# Patient Record
Sex: Male | Born: 1965 | Race: White | Hispanic: No | Marital: Married | Smoking: Current every day smoker
Health system: Southern US, Community
[De-identification: ages and names within clinical notes are randomized; demographics above are authoritative.]

## PROBLEM LIST (undated history)

## (undated) DIAGNOSIS — Z720 Tobacco use: Secondary | ICD-10-CM

## (undated) DIAGNOSIS — I2510 Atherosclerotic heart disease of native coronary artery without angina pectoris: Secondary | ICD-10-CM

## (undated) DIAGNOSIS — R0789 Other chest pain: Secondary | ICD-10-CM

## (undated) HISTORY — DX: Atherosclerotic heart disease of native coronary artery without angina pectoris: I25.10

## (undated) HISTORY — DX: Tobacco use: Z72.0

## (undated) HISTORY — DX: Other chest pain: R07.89

---

## 2004-08-10 ENCOUNTER — Emergency Department (HOSPITAL_COMMUNITY): Admission: EM | Admit: 2004-08-10 | Discharge: 2004-08-10 | Payer: Self-pay | Admitting: Emergency Medicine

## 2011-03-07 ENCOUNTER — Emergency Department (HOSPITAL_COMMUNITY)
Admission: EM | Admit: 2011-03-07 | Discharge: 2011-03-07 | Disposition: A | Discharge status: Home / Self Care | Payer: Worker's Compensation | Attending: Emergency Medicine | Admitting: Emergency Medicine

## 2011-03-07 ENCOUNTER — Emergency Department (HOSPITAL_COMMUNITY): Payer: Worker's Compensation

## 2011-03-07 ENCOUNTER — Other Ambulatory Visit: Payer: Self-pay

## 2011-03-07 DIAGNOSIS — R0789 Other chest pain: Secondary | ICD-10-CM

## 2011-03-07 DIAGNOSIS — R0602 Shortness of breath: Secondary | ICD-10-CM | POA: Insufficient documentation

## 2011-03-07 DIAGNOSIS — F17200 Nicotine dependence, unspecified, uncomplicated: Secondary | ICD-10-CM | POA: Insufficient documentation

## 2011-03-07 LAB — DIFFERENTIAL
Eosinophils Absolute: 0.1 10*3/uL (ref 0.0–0.7)
Monocytes Absolute: 0.8 10*3/uL (ref 0.1–1.0)
Neutro Abs: 6.5 10*3/uL (ref 1.7–7.7)

## 2011-03-07 LAB — CBC
MCHC: 34.4 g/dL (ref 30.0–36.0)
RBC: 4.74 MIL/uL (ref 4.22–5.81)
RDW: 14.1 % (ref 11.5–15.5)

## 2011-03-07 LAB — BASIC METABOLIC PANEL
BUN: 16 mg/dL (ref 6–23)
Creatinine, Ser: 1.39 mg/dL — ABNORMAL HIGH (ref 0.50–1.35)
GFR calc Af Amer: 69 mL/min — ABNORMAL LOW (ref >90)
GFR calc non Af Amer: 60 mL/min — ABNORMAL LOW (ref >90)
Glucose, Bld: 101 mg/dL — ABNORMAL HIGH (ref 70–99)

## 2011-03-07 LAB — CARDIAC PANEL(CRET KIN+CKTOT+MB+TROPI)
CK, MB: 2.4 ng/mL (ref 0.3–4.0)
Total CK: 113 U/L (ref 7–232)
Troponin I: <0.3 ng/mL (ref <0.30)

## 2011-03-07 NOTE — ED Notes (Signed)
Patient transported to X-ray 
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
## 2011-03-07 NOTE — ED Notes (Signed)
Patient returned from X-ray 

## 2011-03-07 NOTE — ED Provider Notes (Signed)
Medical screening examination/treatment/procedure(s) were performed by non-physician practitioner and as supervising physician I was immediately available for consultation/collaboration.   Dione Booze, MD 03/07/11 [phone]

## 2011-03-23 ENCOUNTER — Telehealth: Payer: Self-pay | Admitting: Cardiology

## 2011-03-23 NOTE — Telephone Encounter (Signed)
Left Message for Eric Wall at Dr Daryel Gerald office no fax received unless Pam has it and she is off today. Provided fax number for re-fax.

## 2011-03-23 NOTE — Telephone Encounter (Signed)
DID YOU RE FAX FROM DR HUBBARD ON THIS PT? PLS CALL AND LET HER KNOW EITHER WAY

## 2011-04-01 ENCOUNTER — Ambulatory Visit (INDEPENDENT_AMBULATORY_CARE_PROVIDER_SITE_OTHER): Payer: Worker's Compensation | Admitting: Cardiology

## 2011-04-01 ENCOUNTER — Encounter: Payer: Self-pay | Admitting: Cardiology

## 2011-04-01 DIAGNOSIS — E781 Pure hyperglyceridemia: Secondary | ICD-10-CM | POA: Insufficient documentation

## 2011-04-01 DIAGNOSIS — R072 Precordial pain: Secondary | ICD-10-CM

## 2011-04-01 DIAGNOSIS — R079 Chest pain, unspecified: Secondary | ICD-10-CM

## 2011-04-01 DIAGNOSIS — F17200 Nicotine dependence, unspecified, uncomplicated: Secondary | ICD-10-CM

## 2011-04-01 DIAGNOSIS — Z720 Tobacco use: Secondary | ICD-10-CM | POA: Insufficient documentation

## 2011-04-01 DIAGNOSIS — R0789 Other chest pain: Secondary | ICD-10-CM | POA: Insufficient documentation

## 2011-04-01 NOTE — Assessment & Plan Note (Signed)
I think his chest pain is somewhat atypical but he does have significant risk factors.  I will bring the patient back for a POET (Plain Old Exercise Test). This will allow me to screen for obstructive coronary disease, risk stratify and very importantly provide a prescription for exercise.

## 2011-04-01 NOTE — Patient Instructions (Addendum)
Your physician has requested that you have an exercise tolerance test. For further information please visit [email or web address] Please also follow instruction sheet, as given.  The current medical regimen is effective;  continue present plan and medications. Cholesterol Control Diet Cholesterol levels in your body are determined significantly by your diet. Cholesterol levels may also be related to heart disease. The following material helps to explain this relationship and discusses what you can do to help keep your heart healthy. Not all cholesterol is bad. Low-density lipoprotein (LDL) cholesterol is the "bad" cholesterol. It may cause fatty deposits to build up inside your arteries. High-density lipoprotein (HDL) cholesterol is "good." It helps to remove the "bad" LDL cholesterol from your blood. Cholesterol is a very important risk factor for heart disease. Other risk factors are high blood pressure, smoking, stress, heredity, and weight. The heart muscle gets its supply of blood through the coronary arteries. If your LDL cholesterol is high and your HDL cholesterol is low, you are at risk for having fatty deposits build up in your coronary arteries. This leaves less room through which blood can flow. Without sufficient blood and oxygen, the heart muscle cannot function properly and you may feel chest pains (angina pectoris). When a coronary artery closes up entirely, a part of the heart muscle may die, causing a heart attack (myocardial infarction). CHECKING CHOLESTEROL When your caregiver sends your blood to a lab to be analyzed for cholesterol, a complete lipid (fat) profile may be done. With this test, the total amount of cholesterol and levels of LDL and HDL are determined. Triglycerides are a type of fat that circulates in the blood and can also be used to determine heart disease risk. The list below describes what the numbers should be: Test: Total Cholesterol.  Less than 200 mg/dl.  Test: LDL  "bad cholesterol."  Less than 100 mg/dl.   Less than 70 mg/dl if you are at very high risk of a heart attack or sudden cardiac death.  Test: HDL "good cholesterol."  Greater than 50 mg/dl for women.   Greater than 40 mg/dl for men.  Test: Triglycerides.  Less than 150 mg/dl.  CONTROLLING CHOLESTEROL WITH DIET Although exercise and lifestyle factors are important, your diet is key. That is because certain foods are known to raise cholesterol and others to lower it. The goal is to balance foods for their effect on cholesterol and more importantly, to replace saturated and trans fat with other types of fat, such as monounsaturated fat, polyunsaturated fat, and omega-3 fatty acids. On average, a person should consume no more than 15 to 17 g of saturated fat daily. Saturated and trans fats are considered "bad" fats, and they will raise LDL cholesterol. Saturated fats are primarily found in animal products such as meats, butter, and cream. However, that does not mean you need to sacrifice all your favorite foods. Today, there are good tasting, low-fat, low-cholesterol substitutes for most of the things you like to eat. Choose low-fat or nonfat alternatives. Choose round or loin cuts of red meat, since these types of cuts are lowest in fat and cholesterol. Chicken (without the skin), fish, veal, and ground [place] breast are excellent choices. Eliminate fatty meats, such as hot dogs and salami. Even shellfish have little or no saturated fat. Have a 3 oz (85 g) portion when you eat lean meat, poultry, or fish. Trans fats are also called "partially hydrogenated oils." They are oils that have been scientifically manipulated so that they are solid at  room temperature resulting in a longer shelf life and improved taste and texture of foods in which they are added. Trans fats are found in stick margarine, some tub margarines, cookies, crackers, and baked goods.  When baking and cooking, oils are an excellent  substitute for butter. The monounsaturated oils are especially beneficial since it is believed they lower LDL and raise HDL. The oils you should avoid entirely are saturated tropical oils, such as coconut and palm.  Remember to eat liberally from food groups that are naturally free of saturated and trans fat, including fish, fruit, vegetables, beans, grains (barley, rice, couscous, bulgur wheat), and pasta (without cream sauces).  IDENTIFYING FOODS THAT LOWER CHOLESTEROL  Soluble fiber may lower your cholesterol. This type of fiber is found in fruits such as apples, vegetables such as broccoli, potatoes, and carrots, legumes such as beans, peas, and lentils, and grains such as barley. Foods fortified with plant sterols (phytosterol) may also lower cholesterol. You should eat at least 2 g per day of these foods for a cholesterol lowering effect.  Read package labels to identify low-saturated fats, trans fats free, and low-fat foods at the supermarket. Select cheeses that have only 2 to 3 g saturated fat per ounce. Use a heart-healthy tub margarine that is free of trans fats or partially hydrogenated oil. When buying baked goods (cookies, crackers), avoid partially hydrogenated oils. Breads and muffins should be made from whole grains (whole-wheat or whole oat flour, instead of "flour" or "enriched flour"). Buy non-creamy canned soups with reduced salt and no added fats.  FOOD PREPARATION TECHNIQUES  Never deep-fry. If you must fry, either stir-fry, which uses very little fat, or use non-stick cooking sprays. When possible, broil, bake, or roast meats, and steam vegetables. Instead of dressing vegetables with butter or margarine, use lemon and herbs, applesauce and cinnamon (for squash and sweet potatoes), nonfat yogurt, salsa, and low-fat dressings for salads.  LOW-SATURATED FAT / LOW-FAT FOOD SUBSTITUTES Meats / Saturated Fat (g)  Avoid: Steak, marbled (3 oz/85 g) / 11 g   Choose: Steak, lean (3 oz/85 g)  / 4 g   Avoid: Hamburger (3 oz/85 g) / 7 g   Choose: Hamburger, lean (3 oz/85 g) / 5 g   Avoid: Ham (3 oz/85 g) / 6 g   Choose: Ham, lean cut (3 oz/85 g) / 2.4 g   Avoid: Chicken, with skin, dark meat (3 oz/85 g) / 4 g   Choose: Chicken, skin removed, dark meat (3 oz/85 g) / 2 g   Avoid: Chicken, with skin, light meat (3 oz/85 g) / 2.5 g   Choose: Chicken, skin removed, light meat (3 oz/85 g) / 1 g  Dairy / Saturated Fat (g)  Avoid: Whole milk (1 cup) / 5 g   Choose: Low-fat milk, 2% (1 cup) / 3 g   Choose: Low-fat milk, 1% (1 cup) / 1.5 g   Choose: Skim milk (1 cup) / 0.3 g   Avoid: Hard cheese (1 oz/28 g) / 6 g   Choose: Skim milk cheese (1 oz/28 g) / 2 to 3 g   Avoid: Cottage cheese, 4% fat (1 cup) / 6.5 g   Choose: Low-fat cottage cheese, 1% fat (1 cup) / 1.5 g   Avoid: Ice cream (1 cup) / 9 g   Choose: Sherbet (1 cup) / 2.5 g   Choose: Nonfat frozen yogurt (1 cup) / 0.3 g   Choose: Frozen fruit bar / trace   Avoid:  Whipped cream (1 tbs) / 3.5 g   Choose: Nondairy whipped topping (1 tbs) / 1 g  Condiments / Saturated Fat (g)  Avoid: Mayonnaise (1 tbs) / 2 g   Choose: Low-fat mayonnaise (1 tbs) / 1 g   Avoid: Butter (1 tbs) / 7 g   Choose: Extra light margarine (1 tbs) / 1 g   Avoid: Coconut oil (1 tbs) / 11.8 g   Choose: Olive oil (1 tbs) / 1.8 g   Choose: Corn oil (1 tbs) / 1.7 g   Choose: Safflower oil (1 tbs) / 1.2 g   Choose: Sunflower oil (1 tbs) / 1.4 g   Choose: Soybean oil (1 tbs) / 2.4 g   Choose: Canola oil (1 tbs) / 1 g  Document Released: 03/01/2005 Document Revised: 11/11/2010 Document Reviewed: 08/20/2010 [facility] Patient Information 2012 [place], [place].

## 2011-04-01 NOTE — Assessment & Plan Note (Signed)
We discussed the need to stop smoking. He doesn't want any prescriptions at this point and he will consider this.

## 2011-04-01 NOTE — Assessment & Plan Note (Signed)
He will get a fasting lipid profile when he returns.
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
## 2011-04-02 ENCOUNTER — Ambulatory Visit (INDEPENDENT_AMBULATORY_CARE_PROVIDER_SITE_OTHER): Payer: Worker's Compensation | Admitting: Nurse Practitioner

## 2011-04-02 ENCOUNTER — Ambulatory Visit (INDEPENDENT_AMBULATORY_CARE_PROVIDER_SITE_OTHER): Payer: Worker's Compensation | Admitting: *Deleted

## 2011-04-02 ENCOUNTER — Telehealth: Payer: Self-pay | Admitting: Cardiology

## 2011-04-02 ENCOUNTER — Encounter: Payer: Self-pay | Admitting: Nurse Practitioner

## 2011-04-02 DIAGNOSIS — E781 Pure hyperglyceridemia: Secondary | ICD-10-CM

## 2011-04-02 DIAGNOSIS — R079 Chest pain, unspecified: Secondary | ICD-10-CM

## 2011-04-02 LAB — LIPID PANEL
Cholesterol: 181 mg/dL (ref 0–200)
LDL Cholesterol: 115 mg/dL — ABNORMAL HIGH (ref 0–99)
Total CHOL/HDL Ratio: 6
Triglycerides: 168 mg/dL — ABNORMAL HIGH (ref 0.0–149.0)

## 2011-04-02 NOTE — Progress Notes (Signed)
Exercise Treadmill Test  Pre-Exercise Testing Evaluation Rhythm: normal sinus  Rate: 63   PR:  .15 QRS:  .08  QT:  .39 QTc: .40     Test  Exercise Tolerance Test Ordering MD: Angelina Sheriff, MD  Interpreting MD:  Norma Fredrickson NP  Unique Test No: 1  Treadmill:  1  Indication for ETT: chest pain - rule out ischemia  Contraindication to ETT: No   Stress Modality: exercise - treadmill  Cardiac Imaging Performed: non   Protocol: standard Bruce - maximal  Max BP: 145/80  Max MPHR (bpm):  175 85% MPR (bpm):  148  MPHR obtained (bpm):  153 % MPHR obtained:  88%  Reached 85% MPHR (min:sec):  8:40 Total Exercise Time (min-sec):  9:00  Workload in METS:  10.1 Borg Scale: 16  Reason ETT Terminated:  fatigue    ST Segment Analysis At Rest: normal ST segments - no evidence of significant ST depression With Exercise: no evidence of significant ST depression  Other Information Arrhythmia:  No Angina during ETT:  absent (0) Quality of ETT:  diagnostic  ETT Interpretation:  normal - no evidence of ischemia by ST analysis  Comments: Patient exercised on the standard Bruce protocol for a total of 9 minutes. Good exercise tolerance. Adequate blood pressure response. Clinically negative for chest pain. Test stopped due to fatigue. EKG negative.   Recommendations: Ok to return to work. Regular walking program encouraged. Smoking cessation encouraged. We will see back as needed or if symptoms recur.  Patient is agreeable to this plan and will call if any problems develop in the interim.

## 2011-04-02 NOTE — Telephone Encounter (Signed)
04/02/11--1320p--phoned "sharon"--mobile doc) and explained i will fax notes and testing she is requiring, but pt has already been back for an appoint for labs and a POET and i believe she wants to show these results to PCP--other than that i cannot figuire out what exactly she wants--will share this with pam fleming(dr hochrein's nurse--nt

## 2011-04-02 NOTE — Patient Instructions (Signed)
Ok to return to work. Regular exercise encouraged. Smoking cessation is encouraged. We will see back as needed or if symptoms recur.

## 2011-04-02 NOTE — Telephone Encounter (Signed)
F/U  Eric Wall from [employer] she requested information on fax 1/8.  Encounters show request for docs routed to [facility].  Please return call to Eric Wall to verify and forward required information at   [phone] [facility]   Fax # [phone]

## 2011-04-05 ENCOUNTER — Encounter: Payer: Self-pay | Admitting: *Deleted

## 2015-11-05 ENCOUNTER — Emergency Department (HOSPITAL_BASED_OUTPATIENT_CLINIC_OR_DEPARTMENT_OTHER)
Admission: EM | Admit: 2015-11-05 | Discharge: 2015-11-05 | Disposition: A | Discharge status: Home / Self Care | Payer: Worker's Compensation | Attending: Emergency Medicine | Admitting: Emergency Medicine

## 2015-11-05 ENCOUNTER — Encounter (HOSPITAL_BASED_OUTPATIENT_CLINIC_OR_DEPARTMENT_OTHER): Payer: Self-pay | Admitting: Respiratory Therapy

## 2015-11-05 DIAGNOSIS — Z7982 Long term (current) use of aspirin: Secondary | ICD-10-CM | POA: Insufficient documentation

## 2015-11-05 DIAGNOSIS — M5442 Lumbago with sciatica, left side: Secondary | ICD-10-CM | POA: Insufficient documentation

## 2015-11-05 DIAGNOSIS — F17200 Nicotine dependence, unspecified, uncomplicated: Secondary | ICD-10-CM | POA: Insufficient documentation

## 2015-11-05 DIAGNOSIS — M5432 Sciatica, left side: Secondary | ICD-10-CM

## 2015-11-05 MED ORDER — CYCLOBENZAPRINE HCL 10 MG PO TABS: 10.0000 mg | ORAL_TABLET | Freq: Two times a day (BID) | ORAL | 0 refills | Status: DC | PRN

## 2015-11-05 MED ORDER — PREDNISONE 20 MG PO TABS: 40.0000 mg | ORAL_TABLET | Freq: Every day | ORAL | 0 refills | Status: DC

## 2015-11-05 NOTE — ED Provider Notes (Signed)
[facility] Provider Note   CSN: [id number] Arrival date & time: 11/05/15  1226     History   Chief Complaint Chief Complaint  Patient presents with  . Leg Pain    HPI Eric Wall is a 50 y.o. male.  Patient presents emergency department with chief complaint of left leg pain. He states that he tripped over one of his grandchildren's boys several days ago and since then has had some pain in his left buttock that runs down the back of his leg. He denies any weakness. He states that his symptoms are worsened with sitting they're better when he has up and walking. He denies any bowel or bladder incontinence. He has not tried taking anything for his symptoms. He denies any other medical problems. Denies any drug allergies.   The history is provided by the patient. No language interpreter was used.    Past Medical History:  Diagnosis Date  . Chest pain, mid sternal   . Tobacco abuse     Patient Active Problem List   Diagnosis Date Noted  . Chest pain, mid sternal 04/01/2011  . Tobacco abuse 04/01/2011  . Hypertriglyceridemia 04/01/2011    History reviewed. No pertinent surgical history.     Home Medications    Prior to Admission medications   Medication Sig Start Date End Date Taking? Authorizing Provider  aspirin 81 MG tablet Take 160 mg by mouth daily.    Historical Provider, MD    Family History Family History  Problem Relation Age of Onset  . Heart attack Mother 4769  . Stroke Mother   . Heart attack Father 477    Social History Social History  Substance Use Topics  . Smoking status: Current Every Day Smoker    Packs/day: 1.00  . Smokeless tobacco: Never Used  . Alcohol use No     Allergies   Review of patient's allergies indicates no known allergies.   Review of Systems Review of Systems  All other systems reviewed and are negative.    Physical Exam Updated Vital Signs BP 109/92 (BP Location: Left Arm)   Pulse 83   Temp 98.1 F  (36.7 C) (Oral)   Resp 20   Ht 5\' 11"  (1.803 m)   Wt 87.1 kg   SpO2 100%   BMI 26.78 kg/m   Physical Exam Physical Exam  Constitutional: Pt appears well-developed and well-nourished. No distress.  HENT:  Head: Normocephalic and atraumatic.  Mouth/Throat: Oropharynx is clear and moist. No oropharyngeal exudate.  Eyes: Conjunctivae are normal.  Neck: Normal range of motion. Neck supple.  No meningismus Cardiovascular: Normal rate, regular rhythm and intact distal pulses.   Pulmonary/Chest: Effort normal and breath sounds normal. No respiratory distress. Pt has no wheezes.  Abdominal: Pt exhibits no distension Musculoskeletal:  No lumbar tenderness to palpation, no bony CTLS spine tenderness, deformity, step-off, or crepitus Lymphadenopathy: Pt has no cervical adenopathy.  Neurological: Pt is alert and oriented Speech is clear and goal oriented, follows commands Normal 5/5 strength in upper and lower extremities bilaterally including dorsiflexion and plantar flexion, strong and equal grip strength Sensation intact Great toe extension intact Moves extremities without ataxia, coordination intact Ankle and knee jerk reflexes intact and symmetrical  Normal gait Normal balance No Clonus Skin: Skin is warm and dry. No rash noted. Pt is not diaphoretic. No erythema.  Psychiatric: Pt has a normal mood and affect. Behavior is normal.  Nursing note and vitals reviewed.   ED Treatments / Results  Labs (all labs ordered are listed, but only abnormal results are displayed) Labs Reviewed - No data to display  EKG  EKG Interpretation None       Radiology No results found.  Procedures Procedures (including critical care time)  Medications Ordered in ED Medications - No data to display   Initial Impression / Assessment and Plan / ED Course  I have reviewed the triage vital signs and the nursing notes.  Pertinent labs & imaging results that were available during my care of  the patient were reviewed by me and considered in my medical decision making (see chart for details).  Clinical Course    Patient with back pain.  No neurological deficits and normal neuro exam.  Patient is ambulatory.  No loss of bowel or bladder control.  Doubt cauda equina.  Denies fever,  doubt epidural abscess or other lesion. Recommend back exercises, stretching, RICE, and will treat with a short course of prednisone and flexeril.  Encouraged the patient that there could be a need for additional workup and/or imaging such as MRI, if the symptoms do not resolve. Patient advised that if the back pain does not resolve, or radiates, this could progress to more serious conditions and is encouraged to follow-up with PCP or orthopedics within 2 weeks.     Final Clinical Impressions(s) / ED Diagnoses   Final diagnoses:  Sciatica of left side    New Prescriptions New Prescriptions   CYCLOBENZAPRINE (FLEXERIL) 10 MG TABLET    Take 1 tablet (10 mg total) by mouth 2 (two) times daily as needed for muscle spasms.   PREDNISONE (DELTASONE) 20 MG TABLET    Take 2 tablets (40 mg total) by mouth daily.     Roxy Horsemanobert Dhalia Zingaro, PA-C 11/05/15 1256    Loren Raceravid Yelverton, MD 11/05/15 [phone]

## 2015-11-05 NOTE — ED Notes (Signed)
Describes pain to left buttock which radiates down left leg for 1-1/2 weeks.

## 2015-11-05 NOTE — ED Notes (Signed)
Directed to pharmacy to pick up prescriptions 

## 2015-11-05 NOTE — ED Triage Notes (Signed)
Pt states he fell Sunday-pain to left LE and buttock/hip-NAD-steady gait

## 2016-12-10 ENCOUNTER — Telehealth: Payer: Self-pay | Admitting: *Deleted

## 2016-12-10 NOTE — Telephone Encounter (Signed)
NOTES SENT TO SCHEDULING.  °

## 2016-12-15 NOTE — Progress Notes (Signed)
Cardiology Office Note   Date:  12/17/2016   ID:  Eric Wall, DOB 03-Dec-1965, MRN [id number]  PCP:  Joette Catching, MD  Cardiologist:   Rollene Rotunda, MD  Referring:  Joette Catching, MD  Chief Complaint  Patient presents with  . Arm Pain      History of Present Illness: Eric Wall is a 51 y.o. male who is referred by Joette Catching, MD for evaluation of  left arm pain . I saw him in 2013.  He had a negative POET (Plain Old Exercise Treadmill).  He doesn't remember having chest pain at that time but I did have this reported. He was symptomatic [place].  He's not had any cardiac problems since that time. However, he's been getting some left arm discomfort. This may have been going on for a few years. 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 We'll go to lay down he'll have some dull left arm discomfort in his antecubital fossa. He might get a little short of breath with this. He's also noticed some shortness of breath dragging the garbage cans out to his curb.  He might get some arm discomfort with this as well. He thinks this might be slowly progressive over a couple of years. He's not having any resting shortness of breath, PND or orthopnea. He has no resting chest pressure or neck discomfort.     Past Medical History:  Diagnosis Date  . Chest pain, mid sternal   . Tobacco abuse     No past surgical history on file.   Current Outpatient Prescriptions  Medication Sig Dispense Refill  . aspirin 81 MG tablet Take 160 mg by mouth daily.    Marland Kitchen omeprazole (PRILOSEC) 20 MG capsule Take 1 capsule by mouth daily.  1   No current facility-administered medications for this visit.     Allergies:   Patient has no known allergies.    Social History:  The patient  reports that he has been smoking.  He has been smoking about 1.00 pack per day. He has never used smokeless tobacco. He reports that he does not drink alcohol or  use drugs.   Family History:  The patient's family history includes Heart attack (age of onset: 18) in his mother; Heart attack (age of onset: 75) in his father; Stroke in his mother.    ROS:  Please see the history of present illness.   Otherwise, review of systems are positive for none.   All other systems are reviewed and negative.    PHYSICAL EXAM: VS:  BP 102/76   Pulse 65   Ht  (1.803 m)   Wt 190 lb (86.2 kg)   BMI 26.50 kg/m  , BMI Body mass index is 26.5 kg/m. GENERAL:  Well appearing HEENT:  Pupils equal round and reactive, fundi not visualized, oral mucosa unremarkable NECK:  No jugular venous distention, waveform within normal limits, carotid upstroke brisk and symmetric, no bruits, no thyromegaly LYMPHATICS:  No cervical, inguinal adenopathy LUNGS:  Clear to auscultation bilaterally BACK:  No CVA tenderness CHEST:  Unremarkable HEART:  PMI not displaced or sustained,S1 and S2 within normal limits, no S3, no S4, no clicks, no rubs, no murmurs ABD:  Flat, positive bowel sounds normal in frequency in pitch, no bruits, no rebound, no guarding, no midline pulsatile mass, no hepatomegaly, no splenomegaly EXT:  2 plus pulses throughout, no edema, no cyanosis no clubbing  SKIN:  No rashes no nodules NEURO:  Cranial nerves II through XII grossly intact, motor grossly intact throughout PSYCH:  Cognitively intact, oriented to person place and time    EKG:  EKG is ordered today. The ekg ordered today demonstrates sinus rhythm, rate 65, axis within normal limits, intervals within normal limits, no acute ST-T wave changes.   Recent Labs: No results found for requested labs within last 8760 hours.     Wt Readings from Last 3 Encounters:  12/16/16 190 lb (86.2 kg)  11/05/15 192 lb (87.1 kg)  04/02/11 204 lb (92.5 kg)      Other studies Reviewed: Additional studies/ records that were reviewed today include: Labs Dr. Lysbeth Galas. Review of the above records demonstrates:   Please see elsewhere in the note.     ASSESSMENT AND PLAN:  ARM PAIN:  Given this and his risk factor of smoking I will bring the patient back for a POET (Plain Old Exercise Test). This will allow me to screen for obstructive coronary disease, risk stratify and very importantly provide a prescription for exercise.  We did have a discussion that if he has a negative test but continues to have symptoms or increasing symptoms he would need to let me know. Might consider other testing at that point. However, at this point the pretest probability is low enough that a negative test would likely be a true negative result.  TOBACCO:  I have suggested that he call 1 800 QUIT NOW and consider patches or nicotine gum.     Current medicines are reviewed at length with the patient today.  The patient does not have concerns regarding medicines.  The following changes have been made:  no change  Labs/ tests ordered today include:   Orders Placed This Encounter  Procedures  . Exercise Tolerance Test  . EKG 12-Lead     Disposition:   FU with me as needed.      Signed, Rollene Rotunda, MD  12/17/2016 8:16 AM    [facility]

## 2016-12-16 ENCOUNTER — Encounter: Payer: Self-pay | Admitting: Cardiology

## 2016-12-16 ENCOUNTER — Ambulatory Visit (INDEPENDENT_AMBULATORY_CARE_PROVIDER_SITE_OTHER): Payer: 59 | Admitting: Cardiology

## 2016-12-16 VITALS — BP 102/76 | HR 65 | Ht 71.0 in | Wt 190.0 lb

## 2016-12-16 DIAGNOSIS — Z720 Tobacco use: Secondary | ICD-10-CM | POA: Diagnosis not present

## 2016-12-16 DIAGNOSIS — R079 Chest pain, unspecified: Secondary | ICD-10-CM | POA: Diagnosis not present

## 2016-12-16 NOTE — Patient Instructions (Signed)
Medication Instructions:  Continue current medications  If you need a refill on your cardiac medications before your next appointment, please call your pharmacy.  Labwork: None Ordered   Testing/Procedures: Your physician has requested that you have an exercise tolerance test. For further information please visit [email or web address]. Please also follow instruction sheet, as given.   Follow-Up: Your physician wants you to follow-up in: As Needed.     Thank you for choosing [facility]!!       

## 2016-12-17 ENCOUNTER — Encounter: Payer: Self-pay | Admitting: Cardiology

## 2016-12-22 ENCOUNTER — Telehealth (HOSPITAL_COMMUNITY): Payer: Self-pay

## 2016-12-22 NOTE — Telephone Encounter (Signed)
Encounter complete. 

## 2016-12-23 ENCOUNTER — Ambulatory Visit (HOSPITAL_COMMUNITY)
Admission: RE | Admit: 2016-12-23 | Discharge: 2016-12-23 | Disposition: A | Discharge status: Home / Self Care | Payer: 59 | Source: Ambulatory Visit | Attending: Cardiovascular Disease | Admitting: Cardiovascular Disease

## 2016-12-23 DIAGNOSIS — M79602 Pain in left arm: Secondary | ICD-10-CM | POA: Diagnosis not present

## 2016-12-23 DIAGNOSIS — R079 Chest pain, unspecified: Secondary | ICD-10-CM | POA: Diagnosis not present

## 2016-12-23 LAB — EXERCISE TOLERANCE TEST
CHL CUP MPHR: 170 {beats}/min
CHL RATE OF PERCEIVED EXERTION: 18
CSEPED: 7 min
CSEPHR: 78 %
Estimated workload: 8.6 METS
Exercise duration (sec): 4 s
Peak HR: 133 {beats}/min
Rest HR: 64 {beats}/min

## 2016-12-27 ENCOUNTER — Telehealth: Payer: Self-pay | Admitting: Cardiology

## 2016-12-27 DIAGNOSIS — R5383 Other fatigue: Secondary | ICD-10-CM

## 2016-12-27 DIAGNOSIS — Z01812 Encounter for preprocedural laboratory examination: Secondary | ICD-10-CM

## 2016-12-27 DIAGNOSIS — D689 Coagulation defect, unspecified: Secondary | ICD-10-CM

## 2016-12-27 NOTE — Telephone Encounter (Signed)
@  [facility]  [facility] CARDIOVASCULAR DIVISION [facility] ST OFFICE [address], [address] [place] [address] Dept: [phone] Loc: [phone]  Dedrick Heffner  12/27/2016  You are scheduled for a Cardiac Catheterization on Thursday, October 18 with Dr. Cristal Deer End.  1. Please arrive at the [facility] (Main Entrance A) at [facility]: [address] [place], [place] [address] at 5:30 AM (two hours before your procedure to ensure your preparation). Free valet parking service is available.   Special note: Every effort is made to have your procedure done on time. Please understand that emergencies sometimes delay scheduled procedures.  2. Diet: Do not eat or drink anything after midnight prior to your procedure except sips of water to take medications.  3. Labs: Tomorrow  4. Medication instructions in preparation for your procedure:      Current Outpatient Prescriptions (Analgesics):  .  aspirin 81 MG tablet, Take 160 mg by mouth daily.   Current Outpatient Prescriptions (Other):  .  omeprazole (PRILOSEC) 20 MG capsule, Take 1 capsule by mouth daily. *For reference purposes while preparing patient instructions.   Delete this med list prior to printing instructions for patient.*   On the morning of your procedure, take your Aspirin and any morning medicines NOT listed above.  You may use sips of water.  5. Plan for one night stay--bring personal belongings. 6. Bring a current list of your medications and current insurance cards. 7. You MUST have a responsible person to drive you home. 8. Someone MUST be with you the first 24 hours after you arrive home or your discharge will be delayed. 9. Please wear clothes that are easy to get on and off and wear slip-on shoes.  Thank you for allowing [place] to care for you!   -- [facility]

## 2016-12-27 NOTE — Telephone Encounter (Signed)
F/u   Pt calling to schedule stent.

## 2016-12-27 NOTE — Telephone Encounter (Signed)
New message  Pt verbalized that rn Dr.Hochrein called him for rn to schedule stent  Please call pt

## 2016-12-27 NOTE — Addendum Note (Signed)
Addended by: Barrie Dunker on: 12/27/2016 05:07 PM   Modules accepted: Orders

## 2016-12-28 ENCOUNTER — Telehealth: Payer: Self-pay

## 2016-12-28 NOTE — Telephone Encounter (Signed)
Left detailed message per DPR  Patient contacted pre-catheterization at [facility] scheduled for:  12/30/2016 @ 0730 Verified arrival time and place:  [facility] @ 0530 Confirmed AM meds to be taken pre-cath with sip of water: Take ASA Notified patient must have responsible person to drive home post procedure and observe patient for 24 hours Addl concerns:  Left this nurse name and # for call back if any questions

## 2016-12-29 LAB — APTT: aPTT: 28 s (ref 24–33)

## 2016-12-30 ENCOUNTER — Ambulatory Visit (HOSPITAL_COMMUNITY)
Admission: RE | Disposition: A | Discharge status: Home / Self Care | Payer: Self-pay | Source: Ambulatory Visit | Attending: Internal Medicine

## 2016-12-30 ENCOUNTER — Ambulatory Visit (HOSPITAL_COMMUNITY)
Admission: RE | Admit: 2016-12-30 | Discharge: 2016-12-30 | Disposition: A | Discharge status: Home / Self Care | Payer: 59 | Source: Ambulatory Visit | Attending: Internal Medicine | Admitting: Internal Medicine

## 2016-12-30 DIAGNOSIS — R9439 Abnormal result of other cardiovascular function study: Secondary | ICD-10-CM | POA: Diagnosis not present

## 2016-12-30 DIAGNOSIS — Z79899 Other long term (current) drug therapy: Secondary | ICD-10-CM | POA: Insufficient documentation

## 2016-12-30 DIAGNOSIS — I200 Unstable angina: Secondary | ICD-10-CM | POA: Diagnosis present

## 2016-12-30 DIAGNOSIS — I25119 Atherosclerotic heart disease of native coronary artery with unspecified angina pectoris: Secondary | ICD-10-CM | POA: Diagnosis not present

## 2016-12-30 DIAGNOSIS — Z7982 Long term (current) use of aspirin: Secondary | ICD-10-CM | POA: Insufficient documentation

## 2016-12-30 DIAGNOSIS — F17210 Nicotine dependence, cigarettes, uncomplicated: Secondary | ICD-10-CM | POA: Diagnosis not present

## 2016-12-30 HISTORY — PX: LEFT HEART CATH AND CORONARY ANGIOGRAPHY: CATH118249

## 2016-12-30 LAB — CBC
HEMATOCRIT: 42.9 % (ref 39.0–52.0)
Hemoglobin: 14.4 g/dL (ref 13.0–17.0)
MCH: 30.4 pg (ref 26.0–34.0)
MCHC: 33.6 g/dL (ref 30.0–36.0)
MCV: 90.7 fL (ref 78.0–100.0)
Platelets: 197 10*3/uL (ref 150–400)
RBC: 4.73 MIL/uL (ref 4.22–5.81)
RDW: 13.5 % (ref 11.5–15.5)
WBC: 7.5 10*3/uL (ref 4.0–10.5)

## 2016-12-30 LAB — BASIC METABOLIC PANEL
Anion gap: 9 (ref 5–15)
BUN: 9 mg/dL (ref 6–20)
CHLORIDE: 108 mmol/L (ref 101–111)
CO2: 23 mmol/L (ref 22–32)
CREATININE: 1.19 mg/dL (ref 0.61–1.24)
Calcium: 9.2 mg/dL (ref 8.9–10.3)
Glucose, Bld: 95 mg/dL (ref 65–99)
POTASSIUM: 3.9 mmol/L (ref 3.5–5.1)
SODIUM: 140 mmol/L (ref 135–145)

## 2016-12-30 LAB — LIPID PANEL
CHOL/HDL RATIO: 4.8 ratio
Cholesterol: 153 mg/dL (ref 0–200)
HDL: 32 mg/dL — AB (ref >40)
LDL CALC: 103 mg/dL — AB (ref 0–99)
Triglycerides: 92 mg/dL (ref <150)
VLDL: 18 mg/dL (ref 0–40)

## 2016-12-30 LAB — ALT: ALT: 13 U/L — ABNORMAL LOW (ref 17–63)

## 2016-12-30 LAB — PROTIME-INR
INR: 0.91
Prothrombin Time: 12.2 seconds (ref 11.4–15.2)

## 2016-12-30 SURGERY — LEFT HEART CATH AND CORONARY ANGIOGRAPHY
Anesthesia: LOCAL

## 2016-12-30 MED ORDER — SODIUM CHLORIDE 0.9 % IV SOLN: 250.0000 mL | INTRAVENOUS | Status: DC | PRN

## 2016-12-30 MED ORDER — SODIUM CHLORIDE 0.9% FLUSH: 3.0000 mL | INTRAVENOUS | Status: DC | PRN

## 2016-12-30 MED ORDER — ATORVASTATIN CALCIUM 40 MG PO TABS: 40.0000 mg | ORAL_TABLET | Freq: Every day | ORAL | 11 refills | Status: DC

## 2016-12-30 MED ORDER — FENTANYL CITRATE (PF) 100 MCG/2ML IJ SOLN
INTRAMUSCULAR | Status: AC
  Filled 2016-12-30: qty 2

## 2016-12-30 MED ORDER — HEPARIN (PORCINE) IN NACL 2-0.9 UNIT/ML-% IJ SOLN
INTRAMUSCULAR | Status: AC
  Filled 2016-12-30: qty 1000

## 2016-12-30 MED ORDER — VERAPAMIL HCL 2.5 MG/ML IV SOLN
INTRAVENOUS | Status: AC
  Filled 2016-12-30: qty 2

## 2016-12-30 MED ORDER — HEPARIN (PORCINE) IN NACL 2-0.9 UNIT/ML-% IJ SOLN
INTRAMUSCULAR | Status: AC | PRN
  Administered 2016-12-30: 1000 mL

## 2016-12-30 MED ORDER — LIDOCAINE HCL 2 % IJ SOLN
INTRAMUSCULAR | Status: AC
  Filled 2016-12-30: qty 10

## 2016-12-30 MED ORDER — IOPAMIDOL (ISOVUE-370) INJECTION 76%
INTRAVENOUS | Status: AC
  Filled 2016-12-30: qty 100

## 2016-12-30 MED ORDER — FENTANYL CITRATE (PF) 100 MCG/2ML IJ SOLN
INTRAMUSCULAR | Status: DC | PRN
Start: 2016-12-30 — End: 2016-12-30
  Administered 2016-12-30: 50 ug via INTRAVENOUS

## 2016-12-30 MED ORDER — HEPARIN SODIUM (PORCINE) 1000 UNIT/ML IJ SOLN
INTRAMUSCULAR | Status: DC | PRN
  Administered 2016-12-30: 4500 [IU] via INTRAVENOUS

## 2016-12-30 MED ORDER — IOPAMIDOL (ISOVUE-370) INJECTION 76%
INTRAVENOUS | Status: DC | PRN
  Administered 2016-12-30: 80 mL via INTRA_ARTERIAL

## 2016-12-30 MED ORDER — MIDAZOLAM HCL 2 MG/2ML IJ SOLN
INTRAMUSCULAR | Status: AC
  Filled 2016-12-30: qty 2

## 2016-12-30 MED ORDER — HEPARIN SODIUM (PORCINE) 1000 UNIT/ML IJ SOLN
INTRAMUSCULAR | Status: AC
  Filled 2016-12-30: qty 1

## 2016-12-30 MED ORDER — SODIUM CHLORIDE 0.9% FLUSH: 3.0000 mL | Freq: Two times a day (BID) | INTRAVENOUS | Status: DC

## 2016-12-30 MED ORDER — ISOSORBIDE MONONITRATE ER 30 MG PO TB24: 30.0000 mg | ORAL_TABLET | Freq: Every day | ORAL | 11 refills | Status: DC

## 2016-12-30 MED ORDER — ASPIRIN 81 MG PO CHEW: 81.0000 mg | CHEWABLE_TABLET | Freq: Once | ORAL | Status: DC

## 2016-12-30 MED ORDER — SODIUM CHLORIDE 0.9 % WEIGHT BASED INFUSION: 1.0000 mL/kg/h | INTRAVENOUS | Status: DC

## 2016-12-30 MED ORDER — MIDAZOLAM HCL 2 MG/2ML IJ SOLN
INTRAMUSCULAR | Status: DC | PRN
  Administered 2016-12-30: 1 mg via INTRAVENOUS

## 2016-12-30 MED ORDER — SODIUM CHLORIDE 0.9 % WEIGHT BASED INFUSION
3.0000 mL/kg/h | INTRAVENOUS | Status: AC
  Administered 2016-12-30: 3 mL/kg/h via INTRAVENOUS

## 2016-12-30 MED ORDER — VERAPAMIL HCL 2.5 MG/ML IV SOLN
INTRAVENOUS | Status: DC | PRN
  Administered 2016-12-30: 10 mL via INTRA_ARTERIAL

## 2016-12-30 SURGICAL SUPPLY — 10 items
CATH IMPULSE 5F ANG/FL3.5 (CATHETERS) ×1 IMPLANT
DEVICE RAD COMP TR BAND LRG (VASCULAR PRODUCTS) ×1 IMPLANT
GLIDESHEATH SLEND SS 6F .021 (SHEATH) ×1 IMPLANT
GUIDEWIRE INQWIRE 1.5J.035X260 (WIRE) IMPLANT
INQWIRE 1.5J .035X260CM (WIRE) ×2
KIT HEART LEFT (KITS) ×2 IMPLANT
PACK CARDIAC CATHETERIZATION (CUSTOM PROCEDURE TRAY) ×2 IMPLANT
SYR MEDRAD MARK V 150ML (SYRINGE) ×2 IMPLANT
TRANSDUCER W/STOPCOCK (MISCELLANEOUS) ×2 IMPLANT
TUBING CIL FLEX 10 FLL-RA (TUBING) ×2 IMPLANT

## 2016-12-30 NOTE — H&P (View-Only) (Signed)
Cardiology Office Note   Date:  12/17/2016   ID:  Eric Wall, DOB February 21, 1966, MRN [id number]  PCP:  Joette CatchingNyland, Leonard, MD  Cardiologist:   Rollene RotundaJames Jowell Bossi, MD  Referring:  Joette CatchingNyland, Leonard, MD  Chief Complaint  Patient presents with  . Arm Pain      History of Present Illness: Eric Wall is a 51 y.o. male who is referred by Joette CatchingNyland, Leonard, MD for evaluation of  left arm pain . I saw him in 2013.  He had a negative POET (Plain Old Exercise Treadmill).  He doesn't remember having chest pain at that time but I did have this reported. He was symptomatic after [place] fire.  He's not had any cardiac problems since that time. However, he's been getting some left arm discomfort. This may have been going on for a few years. 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 We'll go to lay down he'll have some dull left arm discomfort in his antecubital fossa. He might get a little short of breath with this. He's also noticed some shortness of breath dragging the garbage cans out to his curb.  He might get some arm discomfort with this as well. He thinks this might be slowly progressive over a couple of years. He's not having any resting shortness of breath, PND or orthopnea. He has no resting chest pressure or neck discomfort.     Past Medical History:  Diagnosis Date  . Chest pain, mid sternal   . Tobacco abuse     No past surgical history on file.   Current Outpatient Prescriptions  Medication Sig Dispense Refill  . aspirin 81 MG tablet Take 160 mg by mouth daily.    Marland Kitchen. omeprazole (PRILOSEC) 20 MG capsule Take 1 capsule by mouth daily.  1   No current facility-administered medications for this visit.     Allergies:   Patient has no known allergies.    Social History:  The patient  reports that he has been smoking.  He has been smoking about 1.00 pack per day. He has never used smokeless tobacco. He reports that he does not drink alcohol or  use drugs.   Family History:  The patient's family history includes Heart attack (age of onset: 1969) in his mother; Heart attack (age of onset: 5877) in his father; Stroke in his mother.    ROS:  Please see the history of present illness.   Otherwise, review of systems are positive for none.   All other systems are reviewed and negative.    PHYSICAL EXAM: VS:  BP 102/76   Pulse 65   Ht 5\' 11"  (1.803 m)   Wt 190 lb (86.2 kg)   BMI 26.50 kg/m  , BMI Body mass index is 26.5 kg/m. GENERAL:  Well appearing HEENT:  Pupils equal round and reactive, fundi not visualized, oral mucosa unremarkable NECK:  No jugular venous distention, waveform within normal limits, carotid upstroke brisk and symmetric, no bruits, no thyromegaly LYMPHATICS:  No cervical, inguinal adenopathy LUNGS:  Clear to auscultation bilaterally BACK:  No CVA tenderness CHEST:  Unremarkable HEART:  PMI not displaced or sustained,S1 and S2 within normal limits, no S3, no S4, no clicks, no rubs, no murmurs ABD:  Flat, positive bowel sounds normal in frequency in pitch, no bruits, no rebound, no guarding, no midline pulsatile mass, no hepatomegaly, no splenomegaly EXT:  2 plus pulses throughout, no edema, no cyanosis no clubbing  SKIN:  No rashes no nodules NEURO:  Cranial nerves II through XII grossly intact, motor grossly intact throughout PSYCH:  Cognitively intact, oriented to person place and time    EKG:  EKG is ordered today. The ekg ordered today demonstrates sinus rhythm, rate 65, axis within normal limits, intervals within normal limits, no acute ST-T wave changes.   Recent Labs: No results found for requested labs within last 8760 hours.     Wt Readings from Last 3 Encounters:  12/16/16 190 lb (86.2 kg)  11/05/15 192 lb (87.1 kg)  04/02/11 204 lb (92.5 kg)      Other studies Reviewed: Additional studies/ records that were reviewed today include: Labs Dr. Lysbeth Galas. Review of the above records demonstrates:   Please see elsewhere in the note.     ASSESSMENT AND PLAN:  ARM PAIN:  Given this and his risk factor of smoking I will bring the patient back for a POET (Plain Old Exercise Test). This will allow me to screen for obstructive coronary disease, risk stratify and very importantly provide a prescription for exercise.  We did have a discussion that if he has a negative test but continues to have symptoms or increasing symptoms he would need to let me know. Might consider other testing at that point. However, at this point the pretest probability is low enough that a negative test would likely be a true negative result.  TOBACCO:  I have suggested that he call 1 800 QUIT NOW and consider patches or nicotine gum.     Current medicines are reviewed at length with the patient today.  The patient does not have concerns regarding medicines.  The following changes have been made:  no change  Labs/ tests ordered today include:   Orders Placed This Encounter  Procedures  . Exercise Tolerance Test  . EKG 12-Lead     Disposition:   FU with me as needed.      Signed, Rollene Rotunda, MD  12/17/2016 8:16 AM    [facility]

## 2016-12-30 NOTE — Discharge Instructions (Signed)
Radial Site Care Refer to this sheet in the next few weeks. These instructions provide you with information about caring for yourself after your procedure. Your health care provider may also give you more specific instructions. Your treatment has been planned according to current medical practices, but problems sometimes occur. Call your health care provider if you have any problems or questions after your procedure. What can I expect after the procedure? After your procedure, it is typical to have the following:  Bruising at the radial site that usually fades within 1-2 weeks.  Blood collecting in the tissue (hematoma) that may be painful to the touch. It should usually decrease in size and tenderness within 1-2 weeks.  Follow these instructions at home:  Take medicines only as directed by your health care provider.  You may shower 24-48 hours after the procedure or as directed by your health care provider. Remove the bandage (dressing) and gently wash the site with plain soap and water. Pat the area dry with a clean towel. Do not rub the site, because this may cause bleeding.  Do not take baths, swim, or use a hot tub until your health care provider approves.  Check your insertion site every day for redness, swelling, or drainage.  Do not apply powder or lotion to the site.  Do not flex or bend the affected arm for 24 hours or as directed by your health care provider.  Do not push or pull heavy objects with the affected arm for 24 hours or as directed by your health care provider.  Do not lift over 10 lb (4.5 kg) for 5 days after your procedure or as directed by your health care provider.  Ask your health care provider when it is okay to: ? Return to work or school. ? Resume usual physical activities or sports. ? Resume sexual activity.  Do not drive home if you are discharged the same day as the procedure. Have someone else drive you.  You may drive 24 hours after the procedure  unless otherwise instructed by your health care provider.  Do not operate machinery or power tools for 24 hours after the procedure.  If your procedure was done as an outpatient procedure, which means that you went home the same day as your procedure, a responsible adult should be with you for the first 24 hours after you arrive home.  Keep all follow-up visits as directed by your health care provider. This is important. Contact a health care provider if:  You have a fever.  You have chills.  You have increased bleeding from the radial site. Hold pressure on the site. Get help right away if:  You have unusual pain at the radial site.  You have redness, warmth, or swelling at the radial site.  You have drainage (other than a small amount of blood on the dressing) from the radial site.  The radial site is bleeding, and the bleeding does not stop after 30 minutes of holding steady pressure on the site.  Your arm or hand becomes pale, cool, tingly, or numb. This information is not intended to replace advice given to you by your health care provider. Make sure you discuss any questions you have with your health care provider. Document Released: 04/03/2010 Document Revised: 08/07/2015 Document Reviewed: 09/17/2013 Elsevier Interactive Patient Education  2018 [employer].  Excuse from Work, [employer], or Physical Activity ______________[name]_____________________________ needs to be excused from: _x___ Work ____ [employer] ____ Physical activity beginning now and through the following date: ___10/22/18_____________. He or she may return to work or school but should still avoid the following physical activity or activities from now until _10/25/18_______________. Activity restrictions include: __x__ Lifting more than __10__ lb ____ Sitting longer than __________ minutes at a time ____ Standing longer than ________  minutes at a time ____ He or she may return to full physical activity as of _10/26/18_______________. Health Care Provider Name (printed): _Christopher End_______________________________________ Health Care Provider (signature): ___________________________________________ Date: __10/18/18______________ This information is not intended to replace advice given to you by your health care provider. Make sure you discuss any questions you have with your health care provider. Document Released: 08/25/2000 Document Revised: 02/13/2016 Document Reviewed: 10/01/2013 Elsevier Interactive Patient Education  [employer]2018 [employer] Inc.

## 2016-12-30 NOTE — Interval H&P Note (Signed)
History and Physical Interval Note:  12/30/2016 6:43 AM  Eric Wall  has presented today for cardiac catheterization, with the diagnosis of accelerating angina and abnormal stress test. The various methods of treatment have been discussed with the patient and family. After consideration of risks, benefits and other options for treatment, the patient has consented to  Procedure(s): LEFT HEART CATH AND CORONARY ANGIOGRAPHY (N/A) as a surgical intervention .  The patient's history has been reviewed, patient examined, no change in status, stable for surgery.  I have reviewed the patient's chart and labs.  Questions were answered to the patient's satisfaction.    Cath Lab Visit (complete for each Cath Lab visit)  Clinical Evaluation Leading to the Procedure:   ACS: No.  Non-ACS:    Anginal Classification: CCS III  Anti-ischemic medical therapy: No Therapy  Non-Invasive Test Results: Intermediate-risk stress test findings: cardiac mortality 1-3%/year  Prior CABG: No previous CABG  Rubin Dais

## 2016-12-31 ENCOUNTER — Encounter (HOSPITAL_COMMUNITY): Payer: Self-pay | Admitting: Internal Medicine

## 2016-12-31 MED FILL — Lidocaine HCl Local Inj 2%: INTRAMUSCULAR | Qty: 10 | Status: AC

## 2017-01-05 NOTE — Progress Notes (Signed)
Cardiology Office Note   Date:  01/06/2017   ID:  Eric Wall, DOB [birth year], MRN [id number]  PCP:  Joette Catching, MD  Cardiologist:   Rollene Rotunda, MD  Referring:  Joette Catching, MD  Chief Complaint  Patient presents with  . Coronary Artery Disease      History of Present Illness: Eric Wall is a 51 y.o. male who is referred by Joette Catching, MD for evaluation of  CAD.  I saw him recently and he had left arm pain.  I sent him for a POET (Plain Old Exercise Treadmill) which was abnormal.  Cath demonstrated an occluded RCA.  He was managed medically.  EF  WAS 50 - 55%.  Since I last saw him he has done well.  The patient denies any new symptoms such as chest discomfort, neck or arm discomfort. There has been no new shortness of breath, PND or orthopnea. There have been no reported palpitations, presyncope or syncope.  He is not getting the arm pain that he was having.  He has dragged the garbage cans up to the hill and this did not bring on the symptoms.  He is on statin and Imdur now.    Past Medical History:  Diagnosis Date  . Chest pain, mid sternal   . Tobacco abuse     Past Surgical History:  Procedure Laterality Date  . LEFT HEART CATH AND CORONARY ANGIOGRAPHY N/A 12/30/2016   Procedure: LEFT HEART CATH AND CORONARY ANGIOGRAPHY;  Surgeon: Yvonne Kendall, MD;  Location: [facility];  Service: Cardiovascular;  Laterality: N/A;     Current Outpatient Prescriptions  Medication Sig Dispense Refill  . aspirin 81 MG tablet Take 81 mg by mouth daily.     Marland Kitchen atorvastatin (LIPITOR) 40 MG tablet Take 1 tablet (40 mg total) by mouth daily. 30 tablet 11  . isosorbide mononitrate (IMDUR) 30 MG 24 hr tablet Take 1 tablet (30 mg total) by mouth daily. 30 tablet 11  . metoprolol succinate (TOPROL XL) 25 MG 24 hr tablet Take 1 tablet (25 mg total) by mouth daily. 90 tablet 3  . nitroGLYCERIN (NITROSTAT) 0.4 MG SL tablet Place 1 tablet (0.4 mg total) under  the tongue every 5 (five) minutes as needed for chest pain. 25 tablet 3  . omeprazole (PRILOSEC) 20 MG capsule Take 1 capsule by mouth daily.  1   No current facility-administered medications for this visit.     Allergies:   Patient has no known allergies.    ROS:  Please see the history of present illness.   Otherwise, review of systems are positive for none.   All other systems are reviewed and negative.    PHYSICAL EXAM: VS:  BP 122/82   Pulse 76   Ht 5\' 11"  (1.803 m)   Wt 194 lb (88 kg)   SpO2 98%   BMI 27.06 kg/m  , BMI Body mass index is 27.06 kg/m.  GENERAL:  Well appearing NECK:  No jugular venous distention, waveform within normal limits, carotid upstroke brisk and symmetric, no bruits, no thyromegaly LUNGS:  Clear to auscultation bilaterally CHEST:  Unremarkable HEART:  PMI not displaced or sustained,S1 and S2 within normal limits, no S3, no S4, no clicks, no rubs, no murmurs ABD:  Flat, positive bowel sounds normal in frequency in pitch, no bruits, no rebound, no guarding, no midline pulsatile mass, no hepatomegaly, no splenomegaly EXT:  2 plus pulses throughout, no edema, no cyanosis no clubbing,  right wrist with slight echymosis and swelling but improved.     EKG:  EKG is not ordered today.   Cardiac cath: 1. Severe single-vessel coronary artery disease with chronic total occlusion of the ostial/proximal LAD. 2. Mild, non-obstructive CAD involving LCx and RCA. 90% stenosis noted at ostium of small rPL branch. 3. Low normal left ventricular contraction (LVEF 50-55%). 4. Mildly elevated left ventricular filling pressure.  Recent Labs: 12/30/2016: ALT 13; BUN 9; Creatinine, Ser 1.19; Hemoglobin 14.4; Platelets 197; Potassium 3.9; Sodium 140     Wt Readings from Last 3 Encounters:  01/06/17 194 lb (88 kg)  12/30/16 195 lb (88.5 kg)  12/16/16 190 lb (86.2 kg)      Other studies Reviewed: Additional studies/ records that were reviewed today include:  Hospital records Review of the above records demonstrates:     ASSESSMENT AND PLAN:  CAD:  He has disease as above.  We will manage this medically.  I will add a beta blocker and can titrate over time.  He will let me know if he has recurrent symptoms going forward.  We will work on risk reduction.   DYSLIPIDEMIA:  He was started on Statin.  I will defer to Joette CatchingNyland, Leonard, MD.  The goal should be an LDL at least less than 70.    TOBACCO:  He is going to see Joette CatchingNyland, Leonard, MD and talk about using Chantix.  I greatly encourage this.     Current medicines are reviewed at length with the patient today.  The patient does not have concerns regarding medicines.  The following changes have been made:  no change  Labs/ tests ordered today include:   None  No orders of the defined types were placed in this encounter.    Disposition:   FU with me in four months.    Signed, Rollene RotundaJames Jaceyon Strole, MD  01/06/2017 11:25 AM    [facility]

## 2017-01-06 ENCOUNTER — Encounter: Payer: Self-pay | Admitting: Cardiology

## 2017-01-06 ENCOUNTER — Ambulatory Visit (INDEPENDENT_AMBULATORY_CARE_PROVIDER_SITE_OTHER): Payer: 59 | Admitting: Cardiology

## 2017-01-06 VITALS — BP 122/82 | HR 76 | Ht 71.0 in | Wt 194.0 lb

## 2017-01-06 DIAGNOSIS — Z720 Tobacco use: Secondary | ICD-10-CM | POA: Diagnosis not present

## 2017-01-06 DIAGNOSIS — I2510 Atherosclerotic heart disease of native coronary artery without angina pectoris: Secondary | ICD-10-CM | POA: Insufficient documentation

## 2017-01-06 DIAGNOSIS — I25118 Atherosclerotic heart disease of native coronary artery with other forms of angina pectoris: Secondary | ICD-10-CM | POA: Diagnosis not present

## 2017-01-06 MED ORDER — METOPROLOL SUCCINATE ER 25 MG PO TB24: 25.0000 mg | ORAL_TABLET | Freq: Every day | ORAL | 3 refills | Status: DC

## 2017-01-06 MED ORDER — NITROGLYCERIN 0.4 MG SL SUBL: 0.4000 mg | SUBLINGUAL_TABLET | SUBLINGUAL | 3 refills | Status: DC | PRN

## 2017-01-06 NOTE — Patient Instructions (Signed)
Medication Instructions:  START- Metoprolol 25 mg daily  If you need a refill on your cardiac medications before your next appointment, please call your pharmacy.  Labwork: None Ordered   Testing/Procedures: None Ordered  Follow-Up: Your physician wants you to follow-up in: 4 Months.    Thank you for choosing [facility] at [facility]!!

## 2017-05-03 ENCOUNTER — Telehealth: Payer: Self-pay | Admitting: Cardiology

## 2017-05-03 NOTE — Telephone Encounter (Signed)
Closed Encounter  °

## 2017-05-08 NOTE — Progress Notes (Signed)
Cardiology Office Note   Date:  05/10/2017   ID:  Herma Carson, DOB 1965-11-11, MRN [id number]  PCP:  Joette Catching, MD  Cardiologist:   Rollene Rotunda, MD  Referring:  Joette Catching, MD  Chief Complaint  Patient presents with  . Shortness of Breath      History of Present Illness: Eric Wall is a 52 y.o. male who is referred by Joette Catching, MD for evaluation of  CAD.  I saw him recently and he had left arm pain.  I sent him for a POET (Plain Old Exercise Treadmill) which was abnormal.  Cath demonstrated an occluded RCA.  He was managed medically.  EF  WAS 50 - 55%.  Since I last saw him hs has some left arm pain but this is less severe than previous.  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  He has 5 grandchildren living with him.  He is active and might occasionally get some left arm pain before he goes to sleep.  He did have SOB walking up a steep incline recently.    Past Medical History:  Diagnosis Date  . Chest pain, mid sternal   . Tobacco abuse     Past Surgical History:  Procedure Laterality Date  . LEFT HEART CATH AND CORONARY ANGIOGRAPHY N/A 12/30/2016   Procedure: LEFT HEART CATH AND CORONARY ANGIOGRAPHY;  Surgeon: Yvonne Kendall, MD;  Location: [facility];  Service: Cardiovascular;  Laterality: N/A;     Current Outpatient Medications  Medication Sig Dispense Refill  . aspirin 81 MG tablet Take 81 mg by mouth daily.     Marland Kitchen atorvastatin (LIPITOR) 40 MG tablet Take 1 tablet (40 mg total) by mouth daily. 30 tablet 11  . isosorbide mononitrate (IMDUR) 60 MG 24 hr tablet Take 1 tablet (60 mg total) by mouth daily. 30 tablet 11  . metoprolol succinate (TOPROL XL) 25 MG 24 hr tablet Take 1 tablet (25 mg total) by mouth daily. 90 tablet 3  . omeprazole (PRILOSEC) 20 MG capsule Take 1 capsule by mouth daily.  1  . nitroGLYCERIN (NITROSTAT) 0.4 MG SL tablet Place 1 tablet (0.4 mg total) under the tongue every 5 (five) minutes as  needed for chest pain. 25 tablet 3   No current facility-administered medications for this visit.     Allergies:   Patient has no known allergies.    ROS:  Please see the history of present illness.   Otherwise, review of systems are positive for none.   All other systems are reviewed and negative.    PHYSICAL EXAM: VS:  BP 118/72   Pulse 66   Ht 5\' 11"  (1.803 m)   Wt 194 lb (88 kg)   SpO2 99%   BMI 27.06 kg/m  , BMI Body mass index is 27.06 kg/m.  GENERAL:  Well appearing NECK:  No jugular venous distention, waveform within normal limits, carotid upstroke brisk and symmetric, no bruits, no thyromegaly LUNGS:  Clear to auscultation bilaterally CHEST:  Unremarkable HEART:  PMI not displaced or sustained,S1 and S2 within normal limits, no S3, no S4, no clicks, no rubs, no murmurs ABD:  Flat, positive bowel sounds normal in frequency in pitch, no bruits, no rebound, no guarding, no midline pulsatile mass, no hepatomegaly, no splenomegaly EXT:  2 plus pulses throughout, no edema, no cyanosis no clubbing   EKG:  EKG is not ordered today.   Cardiac cath: 1. Severe single-vessel coronary artery disease with  chronic total occlusion of the ostial/proximal LAD. 2. Mild, non-obstructive CAD involving LCx and RCA. 90% stenosis noted at ostium of small rPL branch. 3. Low normal left ventricular contraction (LVEF 50-55%). 4. Mildly elevated left ventricular filling pressure.  Recent Labs: 12/30/2016: ALT 13; BUN 9; Creatinine, Ser 1.19; Hemoglobin 14.4; Platelets 197; Potassium 3.9; Sodium 140     Wt Readings from Last 3 Encounters:  05/10/17 194 lb (88 kg)  01/06/17 194 lb (88 kg)  12/30/16 195 lb (88.5 kg)      Other studies Reviewed: Additional studies/ records that were reviewed today include:   None Review of the above records demonstrates:     ASSESSMENT AND PLAN:  CAD: He has a little bit of arm pain.  We are managing will increase his Imdur to 60 mg  daily.  DYSLIPIDEMIA:   His LDL was 48 and HDL 31 in December.  This is at target.  No change in therapy.  TOBACCO: He is cut way back as he is using Chantix.  I applauded and encouraged complete abstinence.    Current medicines are reviewed at length with the patient today.  The patient does not have concerns regarding medicines.  The following changes have been made:  As above  Labs/ tests ordered today include:     No orders of the defined types were placed in this encounter.    Disposition:   FU with me in 12 months.    Signed, Rollene RotundaJames Jamori Biggar, MD  05/10/2017 10:14 AM    [facility]

## 2017-05-10 ENCOUNTER — Ambulatory Visit (INDEPENDENT_AMBULATORY_CARE_PROVIDER_SITE_OTHER): Payer: 59 | Admitting: Cardiology

## 2017-05-10 ENCOUNTER — Encounter: Payer: Self-pay | Admitting: Cardiology

## 2017-05-10 VITALS — BP 118/72 | HR 66 | Ht 71.0 in | Wt 194.0 lb

## 2017-05-10 DIAGNOSIS — Z720 Tobacco use: Secondary | ICD-10-CM | POA: Diagnosis not present

## 2017-05-10 DIAGNOSIS — I25118 Atherosclerotic heart disease of native coronary artery with other forms of angina pectoris: Secondary | ICD-10-CM | POA: Diagnosis not present

## 2017-05-10 MED ORDER — ISOSORBIDE MONONITRATE ER 60 MG PO TB24: 60.0000 mg | ORAL_TABLET | Freq: Every day | ORAL | 11 refills | Status: DC

## 2017-05-10 NOTE — Patient Instructions (Signed)
Medication Instructions:  INCREASE- Imdur(Isosorbide) 60 mg daily  If you need a refill on your cardiac medications before your next appointment, please call your pharmacy.  Labwork: None Ordered  Testing/Procedures: None Ordered   Follow-Up: Your physician wants you to follow-up in: 1 Year. You should receive a reminder letter in the mail two months in advance. If you do not receive a letter, please call our office [phone].    Thank you for choosing [facility] at [facility]!!

## 2017-11-24 ENCOUNTER — Other Ambulatory Visit: Payer: Self-pay | Admitting: Cardiology

## 2017-12-15 ENCOUNTER — Other Ambulatory Visit: Payer: Self-pay | Admitting: Internal Medicine

## 2017-12-15 NOTE — Telephone Encounter (Signed)
Patient is now being seen by Dr. Antoine Poche at [facility] Please review for refill.

## 2018-03-16 ENCOUNTER — Encounter (HOSPITAL_COMMUNITY): Payer: Self-pay

## 2018-03-16 ENCOUNTER — Emergency Department (HOSPITAL_COMMUNITY)
Admission: EM | Admit: 2018-03-16 | Discharge: 2018-03-17 | Disposition: A | Discharge status: Home / Self Care | Payer: 59 | Attending: Emergency Medicine | Admitting: Emergency Medicine

## 2018-03-16 DIAGNOSIS — N400 Enlarged prostate without lower urinary tract symptoms: Secondary | ICD-10-CM | POA: Diagnosis not present

## 2018-03-16 DIAGNOSIS — R319 Hematuria, unspecified: Secondary | ICD-10-CM | POA: Diagnosis present

## 2018-03-16 DIAGNOSIS — N3001 Acute cystitis with hematuria: Secondary | ICD-10-CM | POA: Insufficient documentation

## 2018-03-16 LAB — URINALYSIS, ROUTINE W REFLEX MICROSCOPIC
Bilirubin Urine: NEGATIVE
Glucose, UA: NEGATIVE mg/dL
KETONES UR: 15 mg/dL — AB
NITRITE: POSITIVE — AB
PH: 7 (ref 5.0–8.0)
Protein, ur: 100 mg/dL — AB
Specific Gravity, Urine: >1.03 — ABNORMAL HIGH (ref 1.005–1.030)

## 2018-03-16 LAB — CBC WITH DIFFERENTIAL/PLATELET
Abs Immature Granulocytes: 0.04 10*3/uL (ref 0.00–0.07)
BASOS ABS: 0.1 10*3/uL (ref 0.0–0.1)
Basophils Relative: 1 %
EOS ABS: 0.2 10*3/uL (ref 0.0–0.5)
EOS PCT: 2 %
HEMATOCRIT: 43.7 % (ref 39.0–52.0)
Hemoglobin: 14.4 g/dL (ref 13.0–17.0)
Immature Granulocytes: 0 %
LYMPHS ABS: 2.4 10*3/uL (ref 0.7–4.0)
Lymphocytes Relative: 22 %
MCH: 31 pg (ref 26.0–34.0)
MCHC: 33 g/dL (ref 30.0–36.0)
MCV: 94.2 fL (ref 80.0–100.0)
MONO ABS: 0.9 10*3/uL (ref 0.1–1.0)
MONOS PCT: 8 %
NRBC: 0 % (ref 0.0–0.2)
Neutro Abs: 7.5 10*3/uL (ref 1.7–7.7)
Neutrophils Relative %: 67 %
Platelets: 204 10*3/uL (ref 150–400)
RBC: 4.64 MIL/uL (ref 4.22–5.81)
RDW: 13.1 % (ref 11.5–15.5)
WBC: 11.1 10*3/uL — ABNORMAL HIGH (ref 4.0–10.5)

## 2018-03-16 LAB — URINALYSIS, MICROSCOPIC (REFLEX)

## 2018-03-16 NOTE — ED Provider Notes (Signed)
[facility] Provider Note   CSN: [id number] Arrival date & time: 03/16/18  2023     History   Chief Complaint Chief Complaint  Patient presents with  . Hematuria    HPI Herma CarsonRobert Bryce Wiedeman is a 53 y.o. male with a hx of angina, CAD, hyperlipidemia presents to the Emergency Department complaining of gradual, persistent, progressively worsening hematuria onset this afternoon while at work.  Pt reports associated urinary urgency and urgency but denies dysuria.  He denies blood clots in his urine.  Patient denies fever, chills, headache, neck pain, chest pain, shortness of breath abdominal pain, flank pain, back pain, nausea, vomiting, diarrhea, weakness, dizziness, syncope, dysuria.  Patient does report a 30-pack-year history of smoking.  He has had no history of cancer.  He has no history of renal stones or urinary tract infection.  He denies pain in his testicles or discharge from his penis.  He reports his last sexual encounter was with his wife in July.  He reports he is not concerned about an STD.  The history is provided by the patient and medical records. No language interpreter was used.    Past Medical History:  Diagnosis Date  . Chest pain, mid sternal   . Tobacco abuse     Patient Active Problem List   Diagnosis Date Noted  . Coronary artery disease 01/06/2017  . Accelerating angina (HCC) 12/30/2016  . Abnormal stress test 12/30/2016  . Chest pain, mid sternal 04/01/2011  . Tobacco abuse 04/01/2011  . Hypertriglyceridemia 04/01/2011    Past Surgical History:  Procedure Laterality Date  . LEFT HEART CATH AND CORONARY ANGIOGRAPHY N/A 12/30/2016   Procedure: LEFT HEART CATH AND CORONARY ANGIOGRAPHY;  Surgeon: Yvonne KendallEnd, Christopher, MD;  Location: [facility];  Service: Cardiovascular;  Laterality: N/A;        Home Medications    Prior to Admission medications   Medication Sig Start Date End Date Taking? Authorizing Provider    aspirin 81 MG tablet Take 81 mg by mouth daily.     [provider]  atorvastatin (LIPITOR) 40 MG tablet TAKE 1 TABLET BY MOUTH EVERY DAY 12/15/17   Rollene RotundaHochrein, James, MD  cephALEXin (KEFLEX) 500 MG capsule Take 1 capsule (500 mg total) by mouth 4 (four) times daily. 03/17/18   Orena Cavazos, Dahlia ClientHannah, PA-C  isosorbide mononitrate (IMDUR) 60 MG 24 hr tablet Take 1 tablet (60 mg total) by mouth daily. 05/10/17 05/10/18  Rollene RotundaHochrein, James, MD  metoprolol succinate (TOPROL-XL) 25 MG 24 hr tablet TAKE 1 TABLET BY MOUTH EVERY DAY 11/24/17   Rollene RotundaHochrein, James, MD  nitroGLYCERIN (NITROSTAT) 0.4 MG SL tablet Place 1 tablet (0.4 mg total) under the tongue every 5 (five) minutes as needed for chest pain. 01/06/17 04/06/17  Rollene RotundaHochrein, James, MD  omeprazole (PRILOSEC) 20 MG capsule Take 1 capsule by mouth daily. 11/05/16   [provider]    Family History Family History  Problem Relation Age of Onset  . Heart attack Mother 7569  . Stroke Mother   . Heart attack Father 7577       CABG    Social History Social History   Tobacco Use  . Smoking status: Current Every Day Smoker    Packs/day: 1.00  . Smokeless tobacco: Never Used  Substance Use Topics  . Alcohol use: No  . Drug use: No     Allergies   Patient has no known allergies.   Review of Systems Review of Systems  Constitutional:  Negative for appetite change, diaphoresis, fatigue, fever and unexpected weight change.  HENT: Negative for mouth sores.   Eyes: Negative for visual disturbance.  Respiratory: Negative for cough, chest tightness, shortness of breath and wheezing.   Cardiovascular: Negative for chest pain.  Gastrointestinal: Negative for abdominal pain, constipation, diarrhea, nausea and vomiting.  Endocrine: Negative for polydipsia, polyphagia and polyuria.  Genitourinary: Positive for hematuria. Negative for dysuria, frequency and urgency.  Musculoskeletal: Negative for back pain and neck stiffness.  Skin: Negative for  rash.  Allergic/Immunologic: Negative for immunocompromised state.  Neurological: Negative for syncope, light-headedness and headaches.  Hematological: Does not bruise/bleed easily.  Psychiatric/Behavioral: Negative for sleep disturbance. The patient is not nervous/anxious.   All other systems reviewed and are negative.    Physical Exam Updated Vital Signs BP 123/84 (BP Location: Right Arm)   Pulse 74   Temp 98.4 F (36.9 C) (Oral)   Resp 19   SpO2 100%   Physical Exam Vitals signs and nursing note reviewed.  Constitutional:      General: He is not in acute distress.    Appearance: He is well-developed. He is not diaphoretic.     Comments: Awake, alert, nontoxic appearance  HENT:     Head: Normocephalic and atraumatic.     Mouth/Throat:     Pharynx: No oropharyngeal exudate.  Eyes:     General: No scleral icterus.    Conjunctiva/sclera: Conjunctivae normal.  Neck:     Musculoskeletal: Normal range of motion and neck supple.  Cardiovascular:     Rate and Rhythm: Normal rate and regular rhythm.  Pulmonary:     Effort: Pulmonary effort is normal. No respiratory distress.     Breath sounds: Normal breath sounds. No wheezing.  Abdominal:     General: Bowel sounds are normal. There is distension.     Palpations: Abdomen is soft. There is no mass.     Tenderness: There is no abdominal tenderness. There is no guarding or rebound.     Comments: Mild distention noted  Musculoskeletal: Normal range of motion.  Skin:    General: Skin is warm and dry.  Neurological:     Mental Status: He is alert.     Comments: Speech is clear and goal oriented Moves extremities without ataxia      ED Treatments / Results  Labs (all labs ordered are listed, but only abnormal results are displayed) Labs Reviewed  URINALYSIS, ROUTINE W REFLEX MICROSCOPIC - Abnormal; Notable for the following components:      Result Value   Color, Urine RED (*)    APPearance TURBID (*)    Specific  Gravity, Urine >1.030 (*)    Hgb urine dipstick LARGE (*)    Ketones, ur 15 (*)    Protein, ur 100 (*)    Nitrite POSITIVE (*)    Leukocytes, UA SMALL (*)    All other components within normal limits  URINALYSIS, MICROSCOPIC (REFLEX) - Abnormal; Notable for the following components:   Bacteria, UA RARE (*)    All other components within normal limits  CBC WITH DIFFERENTIAL/PLATELET - Abnormal; Notable for the following components:   WBC 11.1 (*)    All other components within normal limits  BASIC METABOLIC PANEL - Abnormal; Notable for the following components:   CO2 21 (*)    Glucose, Bld 106 (*)    Creatinine, Ser 1.27 (*)    All other components within normal limits  URINE CULTURE     Radiology Ct Abdomen  Pelvis W Contrast  Result Date: 03/17/2018 CLINICAL DATA:  Hematuria. EXAM: CT ABDOMEN AND PELVIS WITH CONTRAST TECHNIQUE: Multidetector CT imaging of the abdomen and pelvis was performed using the standard protocol following bolus administration of intravenous contrast. CONTRAST:  OMNIPAQUE IOHEXOL 300 MG/ML  SOLN COMPARISON:  None. FINDINGS: Lower chest: The lung bases are clear. Hepatobiliary: 3.1 cm cyst in the left hepatic lobe. No suspicious hepatic lesion. Gallbladder physiologically distended, no calcified stone. No biliary dilatation. Pancreas: No ductal dilatation or inflammation. Spleen: Normal in size without focal abnormality. Adrenals/Urinary Tract: Normal adrenal glands. Mild prominence of both renal collecting systems without frank hydronephrosis. No perinephric edema. Simple cyst in the lower left kidney measuring 16 mm, with subcentimeter cysts in the upper left kidney. No suspicious renal lesion. Homogeneous enhancement with symmetric excretion on delayed phase imaging. No urolithiasis. Urinary bladder is markedly distended extending to the umbilicus. Layering hyperdensity in the bladder draping over the prostate gland suggestive of blood clot/hemorrhage, but  nonspecific. No definite bladder wall thickening. No bladder stone. Stomach/Bowel: Stomach is within normal limits. High-density material within otherwise normal appendix. No evidence of bowel wall thickening, distention, or inflammatory changes. Sigmoid colonic diverticulosis without diverticulitis. Vascular/Lymphatic: Moderate aortic atherosclerosis without aneurysm. No enlarged abdominal or pelvic lymph nodes. Reproductive: Heterogeneous prominent prostate gland spans 5.1 x 4.8 cm. There is mass effect on the bladder base. Other: No ascites or free air. Musculoskeletal: There are no acute or suspicious osseous abnormalities. IMPRESSION: 1. Markedly distended urinary bladder extending to the umbilicus. Layering hyperdensity in the bladder is likely blood clot/hemorrhage, given history of hematuria, however nonspecific. Consider cystoscopic evaluation to exclude bladder mass. 2. Heterogeneous enlarged prostate gland causing mass effect on the bladder base. 3. Sigmoid colonic diverticulosis without diverticulitis. 4. High-density material within otherwise normal appendix. This may represent appendicoliths versus retained barium if patient has had prior enteric contrast. No appendicitis. 5.  Aortic Atherosclerosis (ICD10-I70.0). Electronically Signed   By: Narda Rutherford M.D.   On: 03/17/2018 01:21    Procedures Procedures (including critical care time)  Medications Ordered in ED Medications  cefTRIAXone (ROCEPHIN) 1 g in sodium chloride 0.9 % 100 mL IVPB (has no administration in time range)  sodium chloride 0.9 % bolus 1,000 mL (1,000 mLs Intravenous New Bag/Given 03/17/18 0035)  iohexol (OMNIPAQUE) 300 MG/ML solution 100 mL (100 mLs Intravenous Contrast Given 03/17/18 0049)     Initial Impression / Assessment and Plan / ED Course  I have reviewed the triage vital signs and the nursing notes.  Pertinent labs & imaging results that were available during my care of the patient were reviewed by me and  considered in my medical decision making (see chart for details).  Clinical Course as of Mar 17 152  Fri Mar 17, 2018  0139 Hemoglobin: 14.4 [PC]    Clinical Course User Index [PC] Nira Conn, MD    Patient presents with painless hematuria.  Urine is grossly bloody.  She has without abdominal or back pain.  Less likely to be nephrolithiasis however this is a potential.  Patient does not have risky sexual behaviors to indicate STD.  Urinalysis does show positive nitrites and leukocytes however patient has never had a urinary tract infection before.  No white blood cells and only rare bacteria.  Patient is a longtime smoker.  Concern for possible bladder cancer versus nephrolithiasis.  Discussed clinical scenario with radiologist who recommends CT abdomen with contrast.  1:50 AM CT scan with markedly distended bladder and  likely clot within the bladder.  Unclear if urinary retention is secondary to obstructing clot versus enlarged prostate.  Foley catheter placed.  Patient will be discharged home with Foley in place and patient will be referred to urology.  Urine culture sent.  Due to leukocytes and nitrites, patient will be treated for possible urinary tract infection.  Rocephin here in the emergency department.  Keflex for home.  Discussed findings with patient.  He states understanding and is in agreement with the plan.  The patient was discussed with Dr. Eudelia Bunchardama who agrees with the treatment plan.   Final Clinical Impressions(s) / ED Diagnoses   Final diagnoses:  Hematuria, unspecified type  Acute cystitis with hematuria  Enlarged prostate    ED Discharge Orders         Ordered    cephALEXin (KEFLEX) 500 MG capsule  4 times daily     03/17/18 0152           Mandy Fitzwater, Dahlia ClientHannah, PA-C 03/17/18 0154    Cardama, Amadeo GarnetPedro Eduardo, MD 03/17/18 [phone]

## 2018-03-16 NOTE — ED Triage Notes (Signed)
Pt reports blood in his urine since this afternoon. No abd pain, denies n.v.

## 2018-03-17 ENCOUNTER — Emergency Department (HOSPITAL_COMMUNITY): Payer: 59

## 2018-03-17 LAB — BASIC METABOLIC PANEL
Anion gap: 10 (ref 5–15)
BUN: 11 mg/dL (ref 6–20)
CALCIUM: 9.4 mg/dL (ref 8.9–10.3)
CO2: 21 mmol/L — AB (ref 22–32)
CREATININE: 1.27 mg/dL — AB (ref 0.61–1.24)
Chloride: 106 mmol/L (ref 98–111)
GFR calc Af Amer: >60 mL/min (ref >60)
GFR calc non Af Amer: >60 mL/min (ref >60)
GLUCOSE: 106 mg/dL — AB (ref 70–99)
Potassium: 4.1 mmol/L (ref 3.5–5.1)
Sodium: 137 mmol/L (ref 135–145)

## 2018-03-17 MED ORDER — CEPHALEXIN 500 MG PO CAPS: 500.0000 mg | ORAL_CAPSULE | Freq: Four times a day (QID) | ORAL | 0 refills | Status: DC

## 2018-03-17 MED ORDER — CEFTRIAXONE SODIUM 1 G IJ SOLR
1.0000 g | Freq: Once | INTRAMUSCULAR | Status: AC
  Administered 2018-03-17: 1 g via INTRAVENOUS
  Filled 2018-03-17: qty 10

## 2018-03-17 MED ORDER — IOHEXOL 300 MG/ML  SOLN
100.0000 mL | Freq: Once | INTRAMUSCULAR | Status: AC | PRN
  Administered 2018-03-17: 100 mL via INTRAVENOUS

## 2018-03-17 MED ORDER — SODIUM CHLORIDE 0.9 % IV BOLUS
1000.0000 mL | Freq: Once | INTRAVENOUS | Status: AC
  Administered 2018-03-17: 1000 mL via INTRAVENOUS

## 2018-03-17 NOTE — ED Notes (Signed)
Pt stable and ambulatory for discharge, states understanding follow up.  

## 2018-03-17 NOTE — ED Notes (Signed)
Patient transported to CT 

## 2018-03-17 NOTE — Discharge Instructions (Signed)
1. Medications: Keflex, usual home medications 2. Treatment: rest, drink plenty of fluids, take medications as prescribed, Foley care as discussed with the nurse 3. Follow Up: Please followup with allergy in 2-3 days for discussion of your diagnoses and further evaluation after today's visit; return to the ER for fevers, persistent vomiting, worsening abdominal pain or other concerning symptoms.

## 2018-03-18 LAB — URINE CULTURE: CULTURE: NO GROWTH

## 2018-07-04 ENCOUNTER — Telehealth: Payer: Self-pay | Admitting: Cardiology

## 2018-07-04 NOTE — Telephone Encounter (Signed)
lmtcb to schedule yearly check up. Virtual now or OV in august/dc

## 2018-07-05 ENCOUNTER — Telehealth: Payer: Self-pay | Admitting: *Deleted

## 2018-07-05 NOTE — Telephone Encounter (Signed)
07/05/18 LMOM @ 3:05 pm, re: follow up appointment.

## 2018-07-18 NOTE — Progress Notes (Signed)
Virtual Visit via Telephone Note   This visit type was conducted due to national recommendations for restrictions regarding the COVID-19 Pandemic (e.g. social distancing) in an effort to limit this patient's exposure and mitigate transmission in our community.  Due to his co-morbid illnesses, this patient is at least at moderate risk for complications without adequate follow up.  This format is felt to be most appropriate for this patient at this time.  The patient did not have access to video technology/had technical difficulties with video requiring transitioning to audio format only (telephone).  All issues noted in this document were discussed and addressed.  No physical exam could be performed with this format.  Please refer to the patient's chart for his  consent to [facility]health [facility].   Date:  07/19/2018   ID:  Eric Wall, DOB 04/23/1965, MRN [id number]  Patient Location: Home Provider Location: Home  PCP:  Joette CatchingNyland, Leonard, MD  Cardiologist:  Rollene RotundaJames Salvador Coupe, MD  Electrophysiologist:  None   Evaluation Performed:  Follow-Up Visit  Chief Complaint:  SOB  History of Present Illness:    Eric Wall is a 53 y.o. male who presents for follow up of CAD.  He had chest pain and I sent him for a POET (Plain Old Exercise Treadmill) which was abnormal.  Cath demonstrated an occluded RCA.  He was managed medically.  EF  WAS 50 - 55%.  Since I last saw him he has done OK.  He continues to have some SOB.  However, this does not seem to be different than previous.  He will get short of breath if he walks up an incline.  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  He has lots of people who live with him.  He is able to be careful on the job with masks handwashing and screening.  He denies any chest pressure, neck or arm discomfort.  He has had no palpitations, presyncope syncope.  He is not describing PND or orthopnea.  He tried to quit smoking with  Chantix but unfortunately he still smoking.  Of note he has been out of his Imdur.  The patient does not have symptoms concerning for COVID-19 infection (fever, chills, cough, or new shortness of breath).    Past Medical History:  Diagnosis Date  . Chest pain, mid sternal   . Tobacco abuse    Past Surgical History:  Procedure Laterality Date  . LEFT HEART CATH AND CORONARY ANGIOGRAPHY N/A 12/30/2016   Procedure: LEFT HEART CATH AND CORONARY ANGIOGRAPHY;  Surgeon: Yvonne KendallEnd, Christopher, MD;  [facility];  Service: Cardiovascular;  Laterality: N/A;     Current Meds  Medication Sig  . aspirin 81 MG tablet Take 81 mg by mouth daily.   Marland Kitchen. atorvastatin (LIPITOR) 40 MG tablet TAKE 1 TABLET BY MOUTH EVERY DAY  . isosorbide mononitrate (IMDUR) 60 MG 24 hr tablet Take 1 tablet (60 mg total) by mouth daily.  . metoprolol succinate (TOPROL-XL) 25 MG 24 hr tablet TAKE 1 TABLET BY MOUTH EVERY DAY  . nitroGLYCERIN (NITROSTAT) 0.4 MG SL tablet Place 1 tablet (0.4 mg total) under the tongue every 5 (five) minutes as needed for chest pain.  Marland Kitchen. omeprazole (PRILOSEC) 20 MG capsule Take 1 capsule by mouth daily.     Allergies:   Patient has no known allergies.   Social History   Tobacco Use  . Smoking status: Current Every Day Smoker  Packs/day: 1.00  . Smokeless tobacco: Never Used  Substance Use Topics  . Alcohol use: No  . Drug use: No     Family Hx: The patient's family history includes Heart attack (age of onset: 10) in his mother; Heart attack (age of onset: 41) in his father; Stroke in his mother.  ROS:   Please see the history of present illness.    As stated in the HPI and negative for all other systems.   Prior CV studies:   The following studies were reviewed today:  Cath 2018  Labs/Other Tests and Data Reviewed:    EKG:  No ECG reviewed.  Recent Labs: 03/16/2018: BUN 11; Creatinine, Ser 1.27; Hemoglobin 14.4; Platelets 204; Potassium 4.1; Sodium 137    Recent Lipid Panel Lab Results  Component Value Date/Time   CHOL 153 12/30/2016 10:12 AM   TRIG 92 12/30/2016 10:12 AM   HDL 32 (L) 12/30/2016 10:12 AM   CHOLHDL 4.8 12/30/2016 10:12 AM   LDLCALC 103 (H) 12/30/2016 10:12 AM    Wt Readings from Last 3 Encounters:  07/19/18 184 lb 9.6 oz (83.7 kg)  05/10/17 194 lb (88 kg)  01/06/17 194 lb (88 kg)     Objective:    Vital Signs:  Ht 5\' 11"  (1.803 m)   Wt 184 lb 9.6 oz (83.7 kg)   BMI 25.75 kg/m    VITAL SIGNS:  reviewed  ASSESSMENT & PLAN:    CAD:   The patient has no new sypmtoms.   He does have stable SOB.  However, there has been no change since the last cath.  No further cardiovascular testing is indicated.  We will continue with aggressive risk reduction and meds as listed.  I will renew his Imdur and sublingual nitroglycerin.  DYSLIPIDEMIA:     I would like him to get an cholesterol profile from Joette Catching, MD and we will try to facilitate this.   TOBACCO: He tried to quit using Chantix.  He has been unable to quit and he understands the need to continue to try.   COVID-19 Education: The signs and symptoms of COVID-19 were discussed with the patient and how to seek care for testing (follow up with PCP or arrange E-visit).  The importance of social distancing was discussed today.  Time:   Today, I have spent 16 minutes with the patient with telehealth technology discussing the above problems.     Medication Adjustments/Labs and Tests Ordered: Current medicines are reviewed at length with the patient today.  Concerns regarding medicines are outlined above.   Tests Ordered: No orders of the defined types were placed in this encounter.   Medication Changes: No orders of the defined types were placed in this encounter.   Disposition:  Follow up with me in one year  Signed, Rollene Rotunda, MD  07/19/2018 9:10 AM    [facility]

## 2018-07-19 ENCOUNTER — Telehealth (INDEPENDENT_AMBULATORY_CARE_PROVIDER_SITE_OTHER): Payer: 59 | Admitting: Cardiology

## 2018-07-19 ENCOUNTER — Other Ambulatory Visit: Payer: Self-pay

## 2018-07-19 ENCOUNTER — Encounter: Payer: Self-pay | Admitting: Cardiology

## 2018-07-19 VITALS — Ht 71.0 in | Wt 184.6 lb

## 2018-07-19 DIAGNOSIS — Z7189 Other specified counseling: Secondary | ICD-10-CM | POA: Insufficient documentation

## 2018-07-19 DIAGNOSIS — I25118 Atherosclerotic heart disease of native coronary artery with other forms of angina pectoris: Secondary | ICD-10-CM

## 2018-07-19 DIAGNOSIS — Z720 Tobacco use: Secondary | ICD-10-CM

## 2018-07-19 DIAGNOSIS — R0602 Shortness of breath: Secondary | ICD-10-CM | POA: Insufficient documentation

## 2018-07-19 MED ORDER — NITROGLYCERIN 0.4 MG SL SUBL: 0.4000 mg | SUBLINGUAL_TABLET | SUBLINGUAL | 3 refills | Status: DC | PRN

## 2018-07-19 MED ORDER — ISOSORBIDE MONONITRATE ER 60 MG PO TB24: 60.0000 mg | ORAL_TABLET | Freq: Every day | ORAL | 3 refills | Status: DC

## 2018-07-19 NOTE — Patient Instructions (Signed)

## 2019-04-20 ENCOUNTER — Ambulatory Visit: Payer: 59 | Attending: Internal Medicine

## 2019-04-20 DIAGNOSIS — Z20822 Contact with and (suspected) exposure to covid-19: Secondary | ICD-10-CM

## 2019-04-21 LAB — NOVEL CORONAVIRUS, NAA: SARS-CoV-2, NAA: NOT DETECTED

## 2019-10-31 DIAGNOSIS — E785 Hyperlipidemia, unspecified: Secondary | ICD-10-CM | POA: Insufficient documentation

## 2019-10-31 NOTE — Progress Notes (Addendum)
Cardiology Office Note   Date:  11/01/2019   ID:  Eric Wall, DOB 1965/09/24, MRN [id number]  PCP:  Roderick Pee, PA  Cardiologist:   Rollene Rotunda, MD   Chief Complaint  Patient presents with   Coronary Artery Disease      History of Present Illness: Eric Wall is a 54 y.o. male who presents for follow up of CAD.  He had chest pain and I sent him for a POET (Plain Old Exercise Treadmill) which was abnormal.  Cath demonstrated an occluded LAD.  He was managed medically.  EF  WAS 50 - 55%.  Since I last saw him he has done relatively okay.  He has chronic dyspnea with exertion.  This seems to be baseline.  He will get short of breath walking a moderate distance on level ground.  He does still smoke cigarettes.  He does not think this is different than when he had his catheterization a few years ago.  I do note that we did not do an echo at that time.  He does not describe PND or orthopnea.  He does not have palpitations, presyncope or syncope.  He drives a forklift.  He has stress at home.    Past Medical History:  Diagnosis Date   Chest pain, mid sternal    Tobacco abuse     Past Surgical History:  Procedure Laterality Date   LEFT HEART CATH AND CORONARY ANGIOGRAPHY N/A 12/30/2016   Procedure: LEFT HEART CATH AND CORONARY ANGIOGRAPHY;  Surgeon: Yvonne Kendall, MD;  Location: [facility];  Service: Cardiovascular;  Laterality: N/A;     Current Outpatient Medications  Medication Sig Dispense Refill   aspirin 81 MG tablet Take 81 mg by mouth daily.      atorvastatin (LIPITOR) 40 MG tablet TAKE 1 TABLET BY MOUTH EVERY DAY 90 tablet 3   metoprolol succinate (TOPROL-XL) 25 MG 24 hr tablet TAKE 1 TABLET BY MOUTH EVERY DAY 90 tablet 3   naproxen (NAPROSYN) 375 MG tablet Take 375 mg by mouth 2 (two) times daily.     omeprazole (PRILOSEC) 20 MG capsule Take 1 capsule by mouth daily.  1   tamsulosin (FLOMAX) 0.4 MG CAPS capsule Take 1 capsule by mouth  daily.     isosorbide mononitrate (IMDUR) 60 MG 24 hr tablet Take 1 tablet (60 mg total) by mouth daily. 90 tablet 3   nitroGLYCERIN (NITROSTAT) 0.4 MG SL tablet Place 1 tablet (0.4 mg total) under the tongue every 5 (five) minutes as needed for chest pain. 25 tablet 3   No current facility-administered medications for this visit.    Allergies:   Patient has no known allergies.   ROS:  Please see the history of present illness.   Otherwise, review of systems are positive for none.   All other systems are reviewed and negative.    PHYSICAL EXAM: VS:  BP 130/82   Pulse (!) 59   Temp (!) 96.7 F (35.9 C)   Ht 5\' 11"  (1.803 m)   Wt 207 lb 6.4 oz (94.1 kg)   SpO2 98%   BMI 28.93 kg/m  , BMI Body mass index is 28.93 kg/m. GENERAL:  Well appearing NECK:  No jugular venous distention, waveform within normal limits, carotid upstroke brisk and symmetric, no bruits, no thyromegaly LUNGS:  Clear to auscultation bilaterally CHEST:  Unremarkable HEART:  PMI not displaced or sustained,S1 and S2 within normal limits, no S3, no S4, no  clicks, no rubs, no murmurs ABD:  Flat, positive bowel sounds normal in frequency in pitch, no bruits, no rebound, no guarding, no midline pulsatile mass, no hepatomegaly, no splenomegaly EXT:  2 plus pulses throughout, no edema, no cyanosis no clubbing     EKG:  EKG is ordered today. The ekg ordered today demonstrates NSR, rate 59, axis within normal limits, intervals within normal limits, no acute ST-T wave changes.   Recent Labs: No results found for requested labs within last 8760 hours.    Lipid Panel    Component Value Date/Time   CHOL 153 12/30/2016 1012   TRIG 92 12/30/2016 1012   HDL 32 (L) 12/30/2016 1012   CHOLHDL 4.8 12/30/2016 1012   VLDL 18 12/30/2016 1012   LDLCALC 103 (H) 12/30/2016 1012      Wt Readings from Last 3 Encounters:  11/01/19 207 lb 6.4 oz (94.1 kg)  07/19/18 184 lb 9.6 oz (83.7 kg)  05/10/17 194 lb (88 kg)       Other studies Reviewed: Additional studies/ records that were reviewed today include: Labs from [facility]. Review of the above records demonstrates:  Please see elsewhere in the note.     ASSESSMENT AND PLAN:  CAD:   The patient has no new sypmtoms.  No further cardiovascular testing is indicated.  We will continue with aggressive risk reduction and meds as listed.   SOB: I will check an echocardiogram and a BNP level.  Further management will be based on this.  I suspect this is related to his tobacco abuse and probably some chronic lung disease.   DYSLIPIDEMIA:    Is total cholesterol was 114, LDL 73 and HDL 32.  He will continue the meds as listed.   TOBACCO:   We tried Chantix before.  He says with the current stress he is unable to quit smoking.  We talked about this again today.    COVID-19 EDUCATION: He has not been vaccinated and we talked about this.  He will get vaccinated and I asked him not to put this off.  Current medicines are reviewed at length with the patient today.  The patient does not have concerns regarding medicines.  The following changes have been made:  no change  Labs/ tests ordered today include:   Orders Placed This Encounter  Procedures   Brain natriuretic peptide   EKG 12-Lead   ECHOCARDIOGRAM COMPLETE     Disposition:   FU with me in one year or sooner if needed.     Signed, Rollene Rotunda, MD  11/01/2019 11:16 AM    [facility]

## 2019-11-01 ENCOUNTER — Other Ambulatory Visit: Payer: Self-pay

## 2019-11-01 ENCOUNTER — Encounter: Payer: Self-pay | Admitting: Cardiology

## 2019-11-01 ENCOUNTER — Ambulatory Visit (INDEPENDENT_AMBULATORY_CARE_PROVIDER_SITE_OTHER): Payer: 59 | Admitting: Cardiology

## 2019-11-01 VITALS — BP 130/82 | HR 59 | Temp 96.7°F | Ht 71.0 in | Wt 207.4 lb

## 2019-11-01 DIAGNOSIS — E785 Hyperlipidemia, unspecified: Secondary | ICD-10-CM | POA: Diagnosis not present

## 2019-11-01 DIAGNOSIS — I2510 Atherosclerotic heart disease of native coronary artery without angina pectoris: Secondary | ICD-10-CM

## 2019-11-01 DIAGNOSIS — R0602 Shortness of breath: Secondary | ICD-10-CM

## 2019-11-01 DIAGNOSIS — Z720 Tobacco use: Secondary | ICD-10-CM | POA: Diagnosis not present

## 2019-11-01 DIAGNOSIS — Z7189 Other specified counseling: Secondary | ICD-10-CM

## 2019-11-01 NOTE — Patient Instructions (Signed)
Medication Instructions:  No changes *If you need a refill on your cardiac medications before your next appointment, please call your pharmacy*  Lab Work: Your physician recommends that you return for lab work: today (BNP) If you have labs (blood work) drawn today and your tests are completely normal, you will receive your results only by: Marland Kitchen MyChart Message (if you have MyChart) OR . A paper copy in the mail If you have any lab test that is abnormal or we need to change your treatment, we will call you to review the results.  Testing/Procedures: Your physician has requested that you have an echocardiogram. Echocardiography is a painless test that uses sound waves to create images of your heart. It provides your doctor with information about the size and shape of your heart and how well your heart's chambers and valves are working. This procedure takes approximately one hour. There are no restrictions for this procedure.  Follow-Up: At [facility], you and your health needs are our priority.  As part of our continuing mission to provide you with exceptional heart care, we have created designated Provider Care Teams.  These Care Teams include your primary Cardiologist (physician) and Advanced Practice Providers (APPs -  Physician Assistants and Nurse Practitioners) who all work together to provide you with the care you need, when you need it.  Your next appointment:   12 month(s)  You will receive a reminder letter in the mail two months in advance. If you don't receive a letter, please call our office to schedule the follow-up appointment.  The format for your next appointment:   In Person  Provider:   Rollene Rotunda, MD

## 2019-11-02 LAB — BRAIN NATRIURETIC PEPTIDE: BNP: 67.4 pg/mL (ref 0.0–100.0)

## 2019-11-22 ENCOUNTER — Other Ambulatory Visit (HOSPITAL_COMMUNITY): Payer: 59

## 2019-11-28 ENCOUNTER — Other Ambulatory Visit: Payer: Self-pay

## 2019-11-28 ENCOUNTER — Ambulatory Visit (HOSPITAL_COMMUNITY): Payer: 59 | Attending: Internal Medicine

## 2019-11-28 DIAGNOSIS — R0602 Shortness of breath: Secondary | ICD-10-CM | POA: Diagnosis not present

## 2019-11-28 LAB — ECHOCARDIOGRAM COMPLETE
Area-P 1/2: 3.54 cm2
S' Lateral: 2.6 cm

## 2019-12-25 IMAGING — CT CT ABD-PELV W/ CM
2 of 5 series · 15 of 46 positions shown, 17 images · IV contrast (APPLIED)
Comparison: None.

CLINICAL DATA: Hematuria.

EXAM:
CT ABDOMEN AND PELVIS WITH CONTRAST
TECHNIQUE: Multidetector CT imaging of the abdomen and pelvis was performed
using the standard protocol following bolus administration of
intravenous contrast.
CONTRAST:  100mL OMNIPAQUE IOHEXOL 300 MG/ML  SOLN

[Series 3: abdomen 5.0 · axial · 0.93mm/px · z∈[+844,+1254]mm · 12 of 98 slices shown, 14 images]
[im 8/98  soft-tissue]
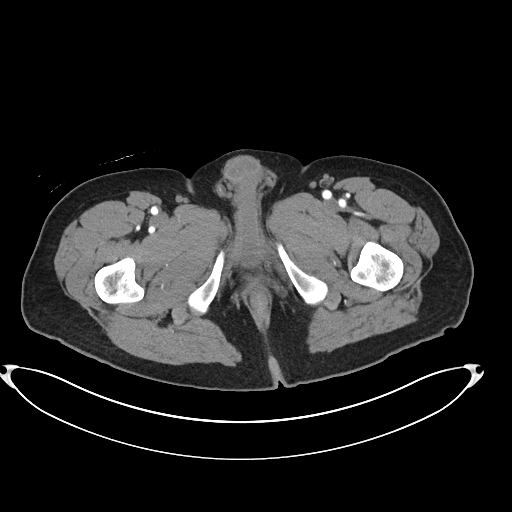
[im 8/98  bone]
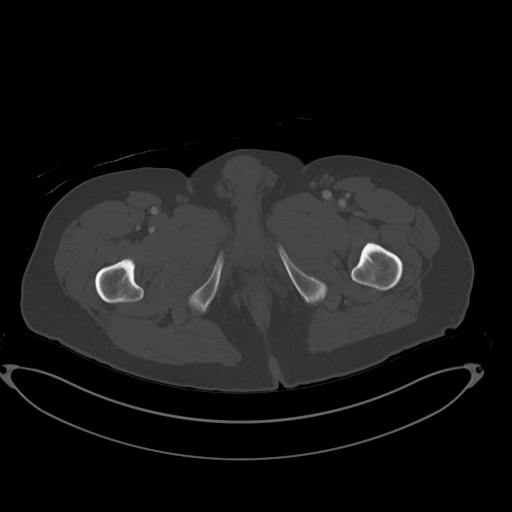
[im 15/98  soft-tissue]
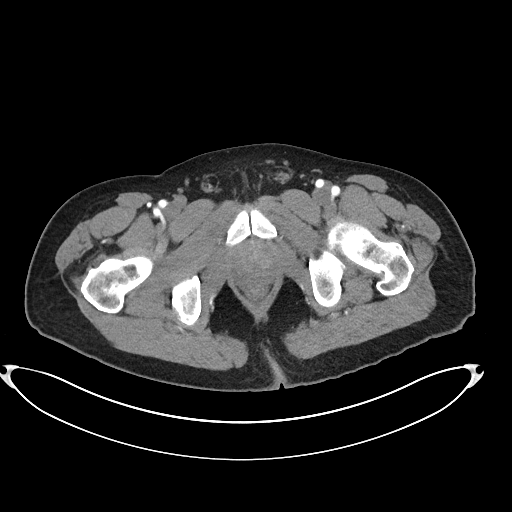
[im 23/98  soft-tissue]
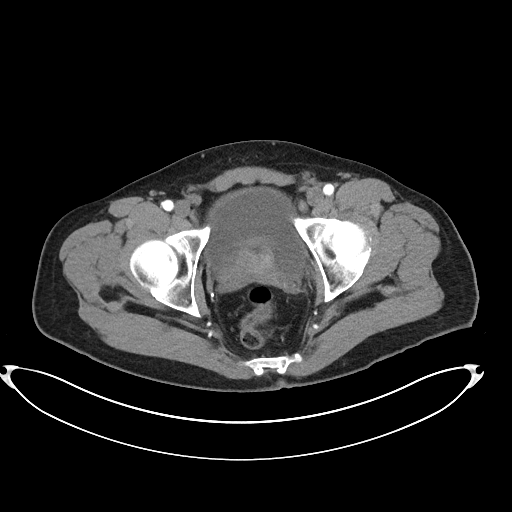
[im 30/98  soft-tissue]
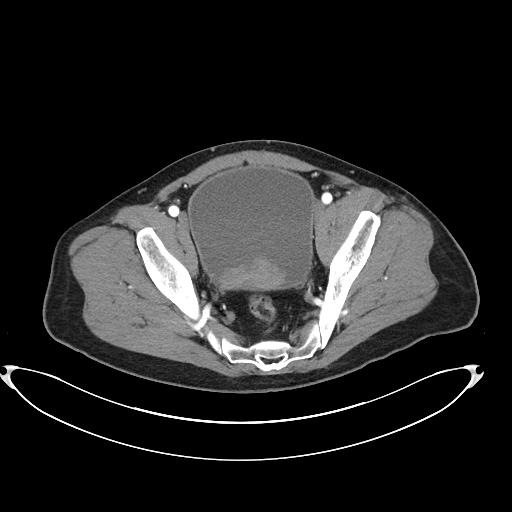
[im 38/98  soft-tissue]
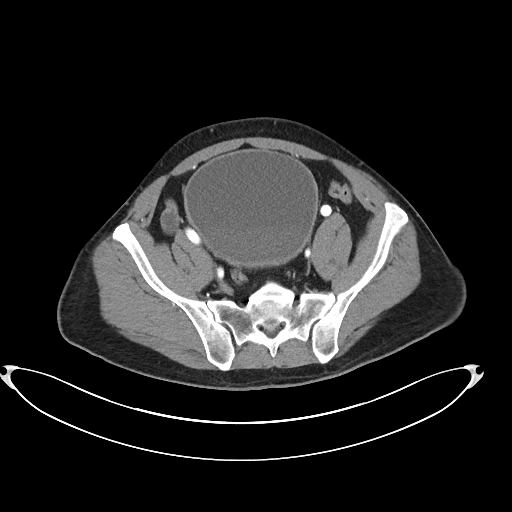
[im 45/98  soft-tissue]
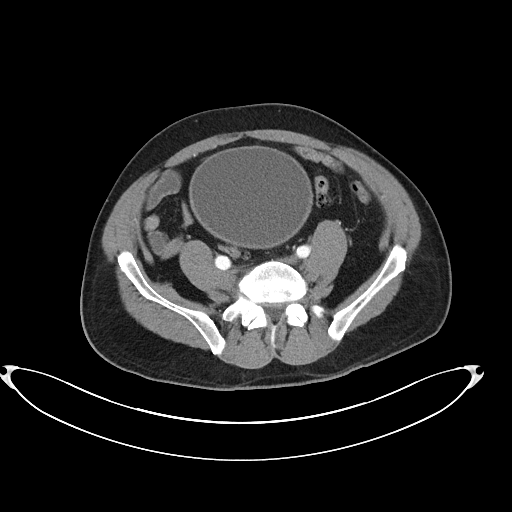
[im 53/98  soft-tissue]
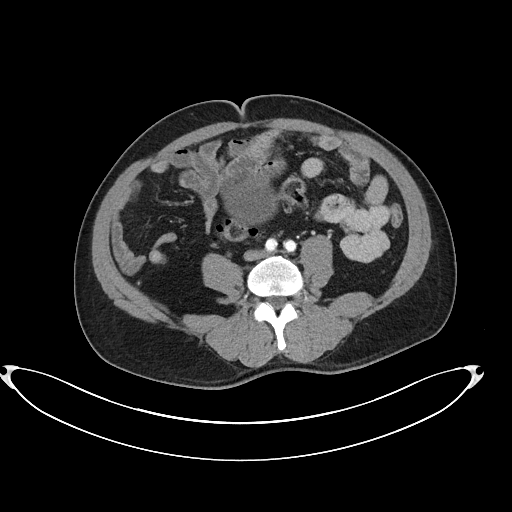
[im 60/98  soft-tissue]
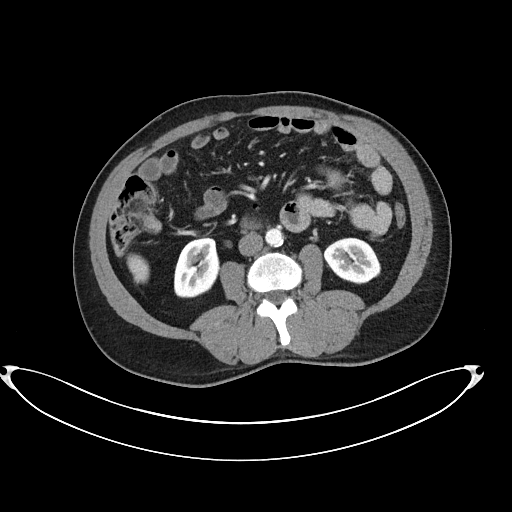
[im 68/98  soft-tissue]
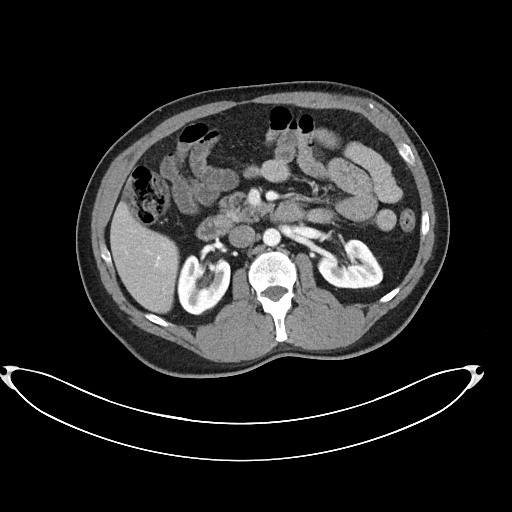
[im 68/98  bone]
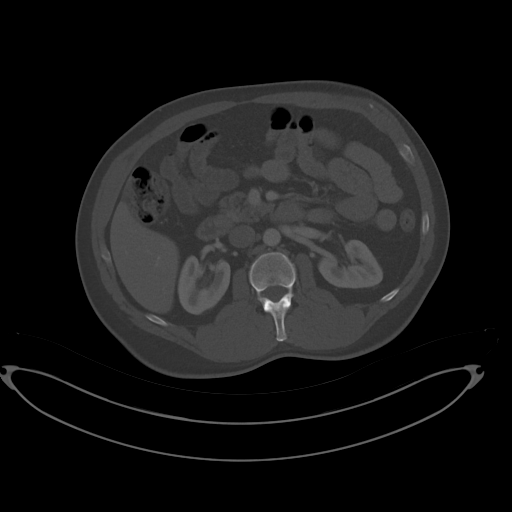
[im 75/98  soft-tissue]
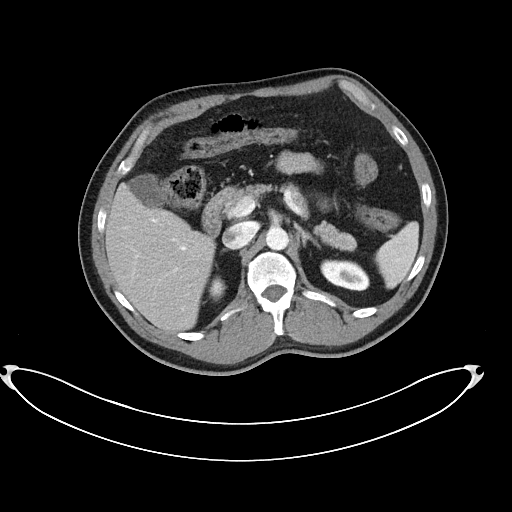
[im 83/98  soft-tissue]
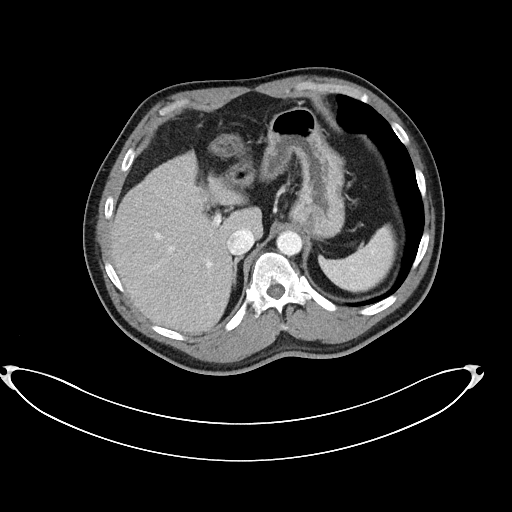
[im 90/98  soft-tissue]
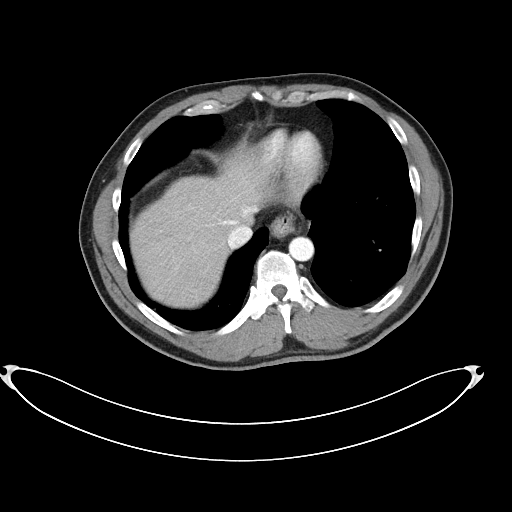

[Series 6: abdomen 3.0 mpr cor · coronal · 0.80mm/px · 3 of 100 slices shown]
[im 34/100  soft-tissue]
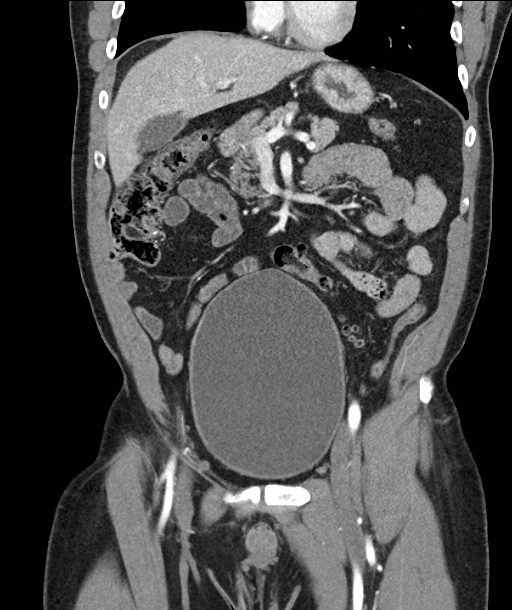
[im 45/100  soft-tissue]
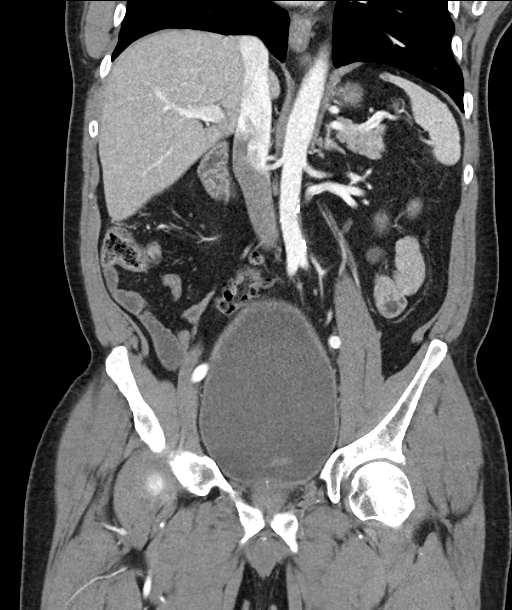
[im 56/100  soft-tissue]
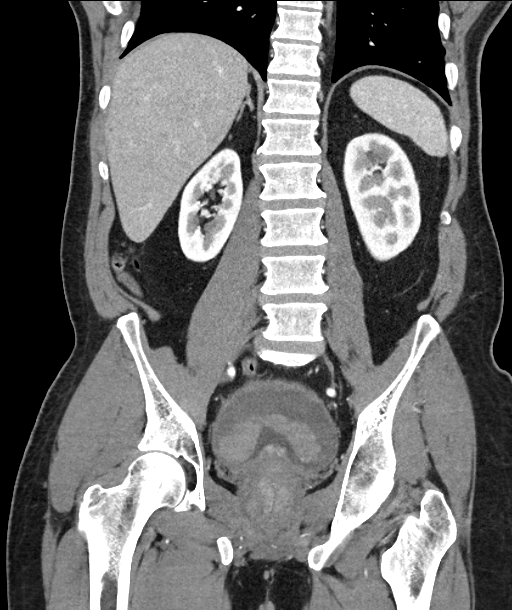

[15 of 46 positions shown; findings below may reference images not displayed]

FINDINGS: Lower chest: The lung bases are clear.

Hepatobiliary: 3.1 cm cyst in the left hepatic lobe. No suspicious
hepatic lesion. Gallbladder physiologically distended, no calcified
stone. No biliary dilatation.

Pancreas: No ductal dilatation or inflammation.

Spleen: Normal in size without focal abnormality.

Adrenals/Urinary Tract: Normal adrenal glands. Mild prominence of
both renal collecting systems without frank hydronephrosis. No
perinephric edema. Simple cyst in the lower left kidney measuring 16
mm, with subcentimeter cysts in the upper left kidney. No suspicious
renal lesion. Homogeneous enhancement with symmetric excretion on
delayed phase imaging. No urolithiasis.

Urinary bladder is markedly distended extending to the umbilicus.
Layering hyperdensity in the bladder draping over the prostate gland
suggestive of blood clot/hemorrhage, but nonspecific. No definite
bladder wall thickening. No bladder stone.

Stomach/Bowel: Stomach is within normal limits. High-density
material within otherwise normal appendix. No evidence of bowel wall
thickening, distention, or inflammatory changes. Sigmoid colonic
diverticulosis without diverticulitis.

Vascular/Lymphatic: Moderate aortic atherosclerosis without
aneurysm. No enlarged abdominal or pelvic lymph nodes.

Reproductive: Heterogeneous prominent prostate gland spans 5.1 x
cm. There is mass effect on the bladder base.

Other: No ascites or free air.

Musculoskeletal: There are no acute or suspicious osseous
abnormalities.
IMPRESSION: 1. Markedly distended urinary bladder extending to the umbilicus.
Layering hyperdensity in the bladder is likely blood
clot/hemorrhage, given history of hematuria, however nonspecific.
Consider cystoscopic evaluation to exclude bladder mass.
2. Heterogeneous enlarged prostate gland causing mass effect on the
bladder base.
3. Sigmoid colonic diverticulosis without diverticulitis.
4. High-density material within otherwise normal appendix. This may
represent appendicoliths versus retained barium if patient has had
prior enteric contrast. No appendicitis.
5.  Aortic Atherosclerosis ([id number]).

## 2020-05-05 ENCOUNTER — Other Ambulatory Visit: Payer: Self-pay | Admitting: Physician Assistant

## 2020-05-05 DIAGNOSIS — Z122 Encounter for screening for malignant neoplasm of respiratory organs: Secondary | ICD-10-CM

## 2020-05-05 DIAGNOSIS — F17210 Nicotine dependence, cigarettes, uncomplicated: Secondary | ICD-10-CM

## 2020-05-22 ENCOUNTER — Other Ambulatory Visit: Payer: Self-pay

## 2020-05-22 ENCOUNTER — Ambulatory Visit
Admission: RE | Admit: 2020-05-22 | Discharge: 2020-05-22 | Disposition: A | Discharge status: Home / Self Care | Payer: 59 | Source: Ambulatory Visit | Attending: Physician Assistant | Admitting: Physician Assistant

## 2020-05-22 DIAGNOSIS — Z122 Encounter for screening for malignant neoplasm of respiratory organs: Secondary | ICD-10-CM

## 2020-05-22 DIAGNOSIS — F17210 Nicotine dependence, cigarettes, uncomplicated: Secondary | ICD-10-CM

## 2020-07-22 ENCOUNTER — Encounter (HOSPITAL_BASED_OUTPATIENT_CLINIC_OR_DEPARTMENT_OTHER): Payer: Self-pay | Admitting: Emergency Medicine

## 2020-07-22 ENCOUNTER — Emergency Department (HOSPITAL_BASED_OUTPATIENT_CLINIC_OR_DEPARTMENT_OTHER)
Admission: EM | Admit: 2020-07-22 | Discharge: 2020-07-22 | Disposition: A | Discharge status: Home / Self Care | Payer: 59 | Attending: Emergency Medicine | Admitting: Emergency Medicine

## 2020-07-22 ENCOUNTER — Other Ambulatory Visit: Payer: Self-pay

## 2020-07-22 DIAGNOSIS — I2510 Atherosclerotic heart disease of native coronary artery without angina pectoris: Secondary | ICD-10-CM | POA: Diagnosis not present

## 2020-07-22 DIAGNOSIS — Z7982 Long term (current) use of aspirin: Secondary | ICD-10-CM | POA: Insufficient documentation

## 2020-07-22 DIAGNOSIS — F17200 Nicotine dependence, unspecified, uncomplicated: Secondary | ICD-10-CM | POA: Diagnosis not present

## 2020-07-22 DIAGNOSIS — K047 Periapical abscess without sinus: Secondary | ICD-10-CM | POA: Diagnosis not present

## 2020-07-22 DIAGNOSIS — K0889 Other specified disorders of teeth and supporting structures: Secondary | ICD-10-CM | POA: Diagnosis present

## 2020-07-22 DIAGNOSIS — Z8616 Personal history of COVID-19: Secondary | ICD-10-CM | POA: Diagnosis not present

## 2020-07-22 MED ORDER — PENICILLIN V POTASSIUM 500 MG PO TABS: 500.0000 mg | ORAL_TABLET | Freq: Four times a day (QID) | ORAL | 0 refills | Status: AC

## 2020-07-22 NOTE — ED Triage Notes (Signed)
Lower dental pain , left oral swelling noted x 1 day.

## 2020-07-22 NOTE — ED Provider Notes (Signed)
[facility] EMERGENCY DEPT Provider Note   CSN: [id number] Arrival date & time: 07/22/20  1540     History Chief Complaint  Patient presents with  . Dental Pain    Eric Wall is a 55 y.o. male.  He is here with a complaint of dental pain that is been going on for about a week.  He has been using home remedies with some improvement but over the last few days has been worsening again.  Now is having some facial swelling.  No fevers.  No difficulty swallowing.  Rates the pain a 7 out of 10 throbbing in nature.  He is a smoker  The history is provided by the patient.  Dental Pain Location:  Lower Lower teeth location:  19/LL 1st molar Quality:  Aching Severity:  Severe Onset quality:  Gradual Duration:  1 week Timing:  Constant Progression:  Worsening Chronicity:  New Context: abscess   Context: not trauma   Relieved by:  Nothing Worsened by:  Pressure Ineffective treatments:  NSAIDs Associated symptoms: facial swelling   Associated symptoms: no difficulty swallowing, no fever and no headaches   Risk factors: smoking        Past Medical History:  Diagnosis Date  . Chest pain, mid sternal   . Tobacco abuse     Patient Active Problem List   Diagnosis Date Noted  . Dyslipidemia 10/31/2019  . SOB (shortness of breath) 07/19/2018  . Educated about COVID-19 virus infection 07/19/2018  . Coronary artery disease 01/06/2017  . Accelerating angina (HCC) 12/30/2016  . Abnormal stress test 12/30/2016  . Chest pain, mid sternal 04/01/2011  . Tobacco abuse 04/01/2011  . Hypertriglyceridemia 04/01/2011    Past Surgical History:  Procedure Laterality Date  . LEFT HEART CATH AND CORONARY ANGIOGRAPHY N/A 12/30/2016   Procedure: LEFT HEART CATH AND CORONARY ANGIOGRAPHY;  Surgeon: Yvonne Kendall, MD;  Location: [facility] LAB;  Service: Cardiovascular;  Laterality: N/A;       Family History  Problem Relation Age of Onset  . Heart attack Mother 56   . Stroke Mother   . Heart attack Father 82       CABG    Social History   Tobacco Use  . Smoking status: Current Every Day Smoker    Packs/day: 1.00  . Smokeless tobacco: Never Used  Substance Use Topics  . Alcohol use: No  . Drug use: No    Home Medications Prior to Admission medications   Medication Sig Start Date End Date Taking? Authorizing Provider  aspirin 81 MG tablet Take 81 mg by mouth daily.     [provider]  atorvastatin (LIPITOR) 40 MG tablet TAKE 1 TABLET BY MOUTH EVERY DAY 12/15/17   Rollene Rotunda, MD  isosorbide mononitrate (IMDUR) 60 MG 24 hr tablet Take 1 tablet (60 mg total) by mouth daily. 07/19/18 07/19/19  Rollene Rotunda, MD  metoprolol succinate (TOPROL-XL) 25 MG 24 hr tablet TAKE 1 TABLET BY MOUTH EVERY DAY 11/24/17   Rollene Rotunda, MD  naproxen (NAPROSYN) 375 MG tablet Take 375 mg by mouth 2 (two) times daily. 10/31/19   [provider]  nitroGLYCERIN (NITROSTAT) 0.4 MG SL tablet Place 1 tablet (0.4 mg total) under the tongue every 5 (five) minutes as needed for chest pain. 07/19/18 10/17/18  Rollene Rotunda, MD  omeprazole (PRILOSEC) 20 MG capsule Take 1 capsule by mouth daily. 11/05/16   [provider]  tamsulosin (FLOMAX) 0.4 MG CAPS capsule Take 1 capsule by  mouth daily. 07/17/18   [provider]    Allergies    Patient has no known allergies.  Review of Systems   Review of Systems  Constitutional: Negative for fever.  HENT: Positive for facial swelling.   Neurological: Negative for headaches.    Physical Exam Updated Vital Signs BP (!) 139/97 (BP Location: Right Arm)   Pulse 68   Temp 98.2 F (36.8 C) (Oral)   Resp 12   Ht 5' 10.5" (1.791 m)   Wt 90.3 kg   SpO2 98%   BMI 28.15 kg/m   Physical Exam Vitals and nursing note reviewed.  Constitutional:      Appearance: Normal appearance. He is well-developed.  HENT:     Head: Normocephalic and atraumatic.     Mouth/Throat:     Mouth: Mucous membranes  are moist.     Dentition: Dental tenderness present.     Tongue: No lesions.     Pharynx: Oropharynx is clear. Uvula midline. No pharyngeal swelling or oropharyngeal exudate.     Tonsils: No tonsillar exudate.  Eyes:     Conjunctiva/sclera: Conjunctivae normal.  Pulmonary:     Effort: Pulmonary effort is normal.  Musculoskeletal:     Cervical back: Normal range of motion and neck supple.  Skin:    General: Skin is warm and dry.  Neurological:     Mental Status: He is alert.     GCS: GCS eye subscore is 4. GCS verbal subscore is 5. GCS motor subscore is 6.     ED Results / Procedures / Treatments   Labs (all labs ordered are listed, but only abnormal results are displayed) Labs Reviewed - No data to display  EKG None  Radiology No results found.  Procedures Procedures   Medications Ordered in ED Medications - No data to display  ED Course  I have reviewed the triage vital signs and the nursing notes.  Pertinent labs & imaging results that were available during my care of the patient were reviewed by me and considered in my medical decision making (see chart for details).    MDM Rules/Calculators/A&P                         No evidence of Ludewig's angina.  No stridor.  Will cover with antibiotics and dental follow-up information given.  Return instructions discussed. Final Clinical Impression(s) / ED Diagnoses Final diagnoses:  Dental infection    Rx / DC Orders ED Discharge Orders    None       Terrilee Files, MD 07/22/20 [phone]

## 2020-11-06 ENCOUNTER — Ambulatory Visit: Payer: 59 | Admitting: Cardiology

## 2020-12-13 NOTE — Progress Notes (Signed)
Cardiology Office Note   Date:  12/15/2020   ID:  Eric Wall, DOB 1965-05-11, MRN [id number]  PCP:  Roderick Pee, PA  Cardiologist:   Rollene Rotunda, MD   Chief Complaint  Patient presents with   Coronary Artery Disease       History of Present Illness: Eric Wall is a 55 y.o. male who presents for follow up of CAD.  He had chest pain and I sent him for a POET (Plain Old Exercise Treadmill) which was abnormal.  Cath demonstrated an occluded LAD.  He was managed medically.  EF  WAS 50 - 55%.   Last year he had SOB with a normal BNP. Echo demonstrated well-preserved ejection fraction..  Since I last saw him he has done well.  He stays busy.  He has a lot of people living in his he is avoiding distress.  He drives a forklift.  The patient denies any new symptoms such as chest discomfort, neck or arm discomfort. There has been no new shortness of breath, PND or orthopnea. There have been no reported palpitations, presyncope or syncope.   Past Medical History:  Diagnosis Date   CAD (coronary artery disease)    Tobacco abuse     Past Surgical History:  Procedure Laterality Date   LEFT HEART CATH AND CORONARY ANGIOGRAPHY N/A 12/30/2016   Procedure: LEFT HEART CATH AND CORONARY ANGIOGRAPHY;  Surgeon: Yvonne Kendall, MD;  [facility];  Service: Cardiovascular;  Laterality: N/A;     Current Outpatient Medications  Medication Sig Dispense Refill   aspirin 81 MG tablet Take 81 mg by mouth daily.      atorvastatin (LIPITOR) 40 MG tablet TAKE 1 TABLET BY MOUTH EVERY DAY 90 tablet 3   metoprolol succinate (TOPROL-XL) 25 MG 24 hr tablet TAKE 1 TABLET BY MOUTH EVERY DAY 90 tablet 3   naproxen (NAPROSYN) 375 MG tablet Take 375 mg by mouth 2 (two) times daily.     omeprazole (PRILOSEC) 20 MG capsule Take 1 capsule by mouth daily.  1   tamsulosin (FLOMAX) 0.4 MG CAPS capsule Take 1 capsule by mouth daily.     isosorbide mononitrate (IMDUR) 60 MG 24 hr  tablet Take 1 tablet (60 mg total) by mouth daily. 90 tablet 3   nitroGLYCERIN (NITROSTAT) 0.4 MG SL tablet Place 1 tablet (0.4 mg total) under the tongue every 5 (five) minutes as needed for chest pain. 25 tablet 3   No current facility-administered medications for this visit.    Allergies:   Patient has no known allergies.   ROS:  Please see the history of present illness.   Otherwise, review of systems are positive for none.   All other systems are reviewed and negative.    PHYSICAL EXAM: VS:  BP 110/80   Pulse (!) 59   Ht 5\' 10"  (1.778 m)   Wt 210 lb (95.3 kg)   SpO2 98%   BMI 30.13 kg/m  , BMI Body mass index is 30.13 kg/m. GENERAL:  Well appearing NECK:  No jugular venous distention, waveform within normal limits, carotid upstroke brisk and symmetric, no bruits, no thyromegaly LUNGS:  Clear to auscultation bilaterally CHEST:  Unremarkable HEART:  PMI not displaced or sustained,S1 and S2 within normal limits, no S3, no S4, no clicks, no rubs, no murmurs ABD:  Flat, positive bowel sounds normal in frequency in pitch, no bruits, no rebound, no guarding, no midline pulsatile mass, no hepatomegaly, no splenomegaly  EXT:  2 plus pulses throughout, no edema, no cyanosis no clubbing   EKG:  EKG is  ordered today. The ekg ordered today demonstrates NSR, rate 59, axis within normal limits, intervals within normal limits, no acute ST-T wave changes.   Recent Labs: No results found for requested labs within last 8760 hours.    Lipid Panel    Component Value Date/Time   CHOL 153 12/30/2016 1012   TRIG 92 12/30/2016 1012   HDL 32 (L) 12/30/2016 1012   CHOLHDL 4.8 12/30/2016 1012   VLDL 18 12/30/2016 1012   LDLCALC 103 (H) 12/30/2016 1012      Wt Readings from Last 3 Encounters:  12/15/20 210 lb (95.3 kg)  07/22/20 199 lb (90.3 kg)  11/01/19 207 lb 6.4 oz (94.1 kg)      Other studies Reviewed: Additional studies/ records that were reviewed today include: Labs Review of  the above records demonstrates:  Please see elsewhere in the note.     ASSESSMENT AND PLAN:  CAD:   The patient has no new sypmtoms.  No further cardiovascular testing is indicated.  We will continue with aggressive risk reduction and meds as listed.  SOB: He actually thinks this is a little bit better than previous.  Work-up last year suggested that likely he is having problems more from his lungs and his heart.  No change in therapy.    DYSLIPIDEMIA:  LDL was okay but this was February 2021 and I will repeat the blood work today.   TOBACCO: We tried Chantix before and it did not help.  We talked at length about stopping smoking and we talked about this again today.  Current medicines are reviewed at length with the patient today.  The patient does not have concerns regarding medicines.  The following changes have been made:  None  Labs/ tests ordered today include:       Orders Placed This Encounter  Procedures   Lipid panel   CBC   Comprehensive metabolic panel   Direct LDL   EKG 12-Lead      Disposition:   FU with me in one year.    Signed, Rollene Rotunda, MD  12/15/2020 8:39 AM    [facility]

## 2020-12-14 ENCOUNTER — Encounter: Payer: Self-pay | Admitting: Cardiology

## 2020-12-15 ENCOUNTER — Ambulatory Visit: Payer: 59 | Admitting: Cardiology

## 2020-12-15 ENCOUNTER — Encounter: Payer: Self-pay | Admitting: Cardiology

## 2020-12-15 ENCOUNTER — Other Ambulatory Visit: Payer: Self-pay

## 2020-12-15 VITALS — BP 110/80 | HR 59 | Ht 70.0 in | Wt 210.0 lb

## 2020-12-15 DIAGNOSIS — R0602 Shortness of breath: Secondary | ICD-10-CM | POA: Diagnosis not present

## 2020-12-15 DIAGNOSIS — I2510 Atherosclerotic heart disease of native coronary artery without angina pectoris: Secondary | ICD-10-CM | POA: Diagnosis not present

## 2020-12-15 DIAGNOSIS — Z79899 Other long term (current) drug therapy: Secondary | ICD-10-CM

## 2020-12-15 DIAGNOSIS — E785 Hyperlipidemia, unspecified: Secondary | ICD-10-CM

## 2020-12-15 DIAGNOSIS — Z720 Tobacco use: Secondary | ICD-10-CM | POA: Diagnosis not present

## 2020-12-15 LAB — CBC
Hematocrit: 44.7 % (ref 37.5–51.0)
Hemoglobin: 15.1 g/dL (ref 13.0–17.7)
MCH: 30.7 pg (ref 26.6–33.0)
MCHC: 33.8 g/dL (ref 31.5–35.7)
MCV: 91 fL (ref 79–97)
Platelets: 203 10*3/uL (ref 150–450)
RBC: 4.92 x10E6/uL (ref 4.14–5.80)
RDW: 12.5 % (ref 11.6–15.4)
WBC: 8.2 10*3/uL (ref 3.4–10.8)

## 2020-12-15 LAB — COMPREHENSIVE METABOLIC PANEL
ALT: 13 IU/L (ref 0–44)
AST: 16 IU/L (ref 0–40)
Albumin/Globulin Ratio: 1.5 (ref 1.2–2.2)
Albumin: 4 g/dL (ref 3.8–4.9)
Alkaline Phosphatase: 75 IU/L (ref 44–121)
BUN/Creatinine Ratio: 10 (ref 9–20)
BUN: 13 mg/dL (ref 6–24)
Bilirubin Total: 0.3 mg/dL (ref 0.0–1.2)
CO2: 24 mmol/L (ref 20–29)
Calcium: 9.5 mg/dL (ref 8.7–10.2)
Chloride: 104 mmol/L (ref 96–106)
Creatinine, Ser: 1.25 mg/dL (ref 0.76–1.27)
Globulin, Total: 2.6 g/dL (ref 1.5–4.5)
Glucose: 74 mg/dL (ref 70–99)
Potassium: 4.8 mmol/L (ref 3.5–5.2)
Sodium: 140 mmol/L (ref 134–144)
Total Protein: 6.6 g/dL (ref 6.0–8.5)
eGFR: 68 mL/min/{1.73_m2} (ref >59)

## 2020-12-15 LAB — LIPID PANEL
Chol/HDL Ratio: 3.2 ratio (ref 0.0–5.0)
Cholesterol, Total: 107 mg/dL (ref 100–199)
HDL: 33 mg/dL — ABNORMAL LOW (ref >39)
LDL Chol Calc (NIH): 54 mg/dL (ref 0–99)
Triglycerides: 106 mg/dL (ref 0–149)
VLDL Cholesterol Cal: 20 mg/dL (ref 5–40)

## 2020-12-15 LAB — LDL CHOLESTEROL, DIRECT: LDL Direct: 55 mg/dL (ref 0–99)

## 2020-12-15 MED ORDER — NITROGLYCERIN 0.4 MG SL SUBL: 0.4000 mg | SUBLINGUAL_TABLET | SUBLINGUAL | 3 refills | Status: AC | PRN

## 2020-12-15 MED ORDER — ISOSORBIDE MONONITRATE ER 60 MG PO TB24
60.0000 mg | ORAL_TABLET | Freq: Every day | ORAL | 3 refills | Status: DC
Start: 2020-12-15 — End: 2021-12-11

## 2020-12-15 NOTE — Patient Instructions (Signed)
Medication Instructions:  Your Physician recommend you continue on your current medication as directed.    *If you need a refill on your cardiac medications before your next appointment, please call your pharmacy*   Lab Work: Your physician recommends lab work today (Lipid, direct LDL, CMP, CBC).  If you have labs (blood work) drawn today and your tests are completely normal, you will receive your results only by: MyChart Message (if you have MyChart) OR A paper copy in the mail If you have any lab test that is abnormal or we need to change your treatment, we will call you to review the results.   Testing/Procedures: None ordered today   Follow-Up: At [facility], you and your health needs are our priority.  As part of our continuing mission to provide you with exceptional heart care, we have created designated Provider Care Teams.  These Care Teams include your primary Cardiologist (physician) and Advanced Practice Providers (APPs -  Physician Assistants and Nurse Practitioners) who all work together to provide you with the care you need, when you need it.  We recommend signing up for the patient portal called "MyChart".  Sign up information is provided on this After Visit Summary.  MyChart is used to connect with patients for Virtual Visits (Telemedicine).  Patients are able to view lab/test results, encounter notes, upcoming appointments, etc.  Non-urgent messages can be sent to your provider as well.   To learn more about what you can do with MyChart, go to [email or web address].    Your next appointment:   1 year(s)  The format for your next appointment:   In Person  Provider:   Rollene Rotunda, MD

## 2020-12-25 ENCOUNTER — Encounter: Payer: Self-pay | Admitting: *Deleted

## 2021-04-09 ENCOUNTER — Other Ambulatory Visit: Payer: Self-pay

## 2021-04-09 ENCOUNTER — Encounter (HOSPITAL_BASED_OUTPATIENT_CLINIC_OR_DEPARTMENT_OTHER): Payer: Self-pay

## 2021-04-09 ENCOUNTER — Emergency Department (HOSPITAL_BASED_OUTPATIENT_CLINIC_OR_DEPARTMENT_OTHER)
Admission: EM | Admit: 2021-04-09 | Discharge: 2021-04-10 | Disposition: A | Discharge status: Home / Self Care | Payer: 59 | Attending: Emergency Medicine | Admitting: Emergency Medicine

## 2021-04-09 DIAGNOSIS — Z7982 Long term (current) use of aspirin: Secondary | ICD-10-CM | POA: Insufficient documentation

## 2021-04-09 DIAGNOSIS — R310 Gross hematuria: Secondary | ICD-10-CM | POA: Insufficient documentation

## 2021-04-09 DIAGNOSIS — I2510 Atherosclerotic heart disease of native coronary artery without angina pectoris: Secondary | ICD-10-CM | POA: Diagnosis not present

## 2021-04-09 DIAGNOSIS — R319 Hematuria, unspecified: Secondary | ICD-10-CM | POA: Diagnosis present

## 2021-04-09 LAB — URINALYSIS, ROUTINE W REFLEX MICROSCOPIC: Specific Gravity, Urine: <1.005 — ABNORMAL LOW (ref 1.005–1.030)

## 2021-04-09 LAB — URINALYSIS, MICROSCOPIC (REFLEX): RBC / HPF: >50 RBC/hpf (ref 0–5)

## 2021-04-09 NOTE — ED Triage Notes (Signed)
Patient here POV from Home with Hematuria.  Symptoms have been present since Friday but Patient stated today he noticed more Blood.  No Fevers. No Pain. Described as Dark Red.   NAD Noted during Triage. A&Ox4. GCS 15. Ambulatory.

## 2021-04-10 MED ORDER — CEPHALEXIN 250 MG PO CAPS
500.0000 mg | ORAL_CAPSULE | Freq: Once | ORAL | Status: AC
  Administered 2021-04-10: 500 mg via ORAL
  Filled 2021-04-10: qty 2

## 2021-04-10 MED ORDER — CEPHALEXIN 500 MG PO CAPS: 500.0000 mg | ORAL_CAPSULE | Freq: Three times a day (TID) | ORAL | 0 refills | Status: AC

## 2021-04-10 NOTE — ED Provider Notes (Signed)
[facility] EMERGENCY DEPT Provider Note   CSN: [id number] Arrival date & time: 04/09/21  2127     History  Chief Complaint  Patient presents with   Hematuria    Eric Wall is a 56 y.o. male.  The history is provided by the patient.  Hematuria He had his history of coronary artery disease and comes in because he is passing bloody urine today.  6 days ago, he had some cloudiness in his urine which has waxed and waned.  He denies any dysuria.  Red urine just started today.  He denies any abdominal pain or flank pain he denies any fever or chills.  He had a similar episode 3 years ago which actually led to urinary retention requiring Foley catheter.  He was referred to urology and they followed him for over a year before releasing him.  Cystoscopy had been negative at that time.   Home Medications Prior to Admission medications   Medication Sig Start Date End Date Taking? Authorizing Provider  aspirin 81 MG tablet Take 81 mg by mouth daily.     [provider]  atorvastatin (LIPITOR) 40 MG tablet TAKE 1 TABLET BY MOUTH EVERY DAY 12/15/17   Minus Breeding, MD  isosorbide mononitrate (IMDUR) 60 MG 24 hr tablet Take 1 tablet (60 mg total) by mouth daily. 12/15/20 12/15/21  Minus Breeding, MD  metoprolol succinate (TOPROL-XL) 25 MG 24 hr tablet TAKE 1 TABLET BY MOUTH EVERY DAY 11/24/17   Minus Breeding, MD  naproxen (NAPROSYN) 375 MG tablet Take 375 mg by mouth 2 (two) times daily. 10/31/19   [provider]  nitroGLYCERIN (NITROSTAT) 0.4 MG SL tablet Place 1 tablet (0.4 mg total) under the tongue every 5 (five) minutes as needed for chest pain. 12/15/20 03/15/21  Minus Breeding, MD  omeprazole (PRILOSEC) 20 MG capsule Take 1 capsule by mouth daily. 11/05/16   [provider]  tamsulosin (FLOMAX) 0.4 MG CAPS capsule Take 1 capsule by mouth daily. 07/17/18   [provider]      Allergies    Patient has no known allergies.    Review of  Systems   Review of Systems  Genitourinary:  Positive for hematuria.  All other systems reviewed and are negative.  Physical Exam Updated Vital Signs BP (!) 129/91 (BP Location: Left Arm)    Pulse 63    Temp 98.5 F (36.9 C) (Oral)    Resp 16    Ht 5\' 10"  (1.778 m)    Wt 95.3 kg    SpO2 98%    BMI 30.15 kg/m  Physical Exam Vitals and nursing note reviewed.  56 year old male, resting comfortably and in no acute distress. Vital signs are significant for borderline elevated blood pressure. Oxygen saturation is 98%, which is normal. Head is normocephalic and atraumatic. PERRLA, EOMI. Oropharynx is clear. Neck is nontender and supple without adenopathy or JVD. Back is nontender and there is no CVA tenderness. Lungs are clear without rales, wheezes, or rhonchi. Chest is nontender. Heart has regular rate and rhythm without murmur. Abdomen is soft, flat, nontender. Extremities have no cyanosis or edema, full range of motion is present. Skin is warm and dry without rash. Neurologic: Mental status is normal, cranial nerves are intact, moves all extremities equally.  ED Results / Procedures / Treatments   Labs (all labs ordered are listed, but only abnormal results are displayed) Labs Reviewed  URINALYSIS, ROUTINE W REFLEX MICROSCOPIC - Abnormal; Notable for the following components:  Result Value   Color, Urine RED (*)    APPearance CLOUDY (*)    Specific Gravity, Urine <1.005 (*)    Glucose, UA   (*)    Value: TEST NOT REPORTED DUE TO COLOR INTERFERENCE OF URINE PIGMENT   Hgb urine dipstick   (*)    Value: TEST NOT REPORTED DUE TO COLOR INTERFERENCE OF URINE PIGMENT   Bilirubin Urine   (*)    Value: TEST NOT REPORTED DUE TO COLOR INTERFERENCE OF URINE PIGMENT   Ketones, ur   (*)    Value: TEST NOT REPORTED DUE TO COLOR INTERFERENCE OF URINE PIGMENT   Protein, ur   (*)    Value: TEST NOT REPORTED DUE TO COLOR INTERFERENCE OF URINE PIGMENT   Nitrite   (*)    Value: TEST NOT  REPORTED DUE TO COLOR INTERFERENCE OF URINE PIGMENT   Leukocytes,Ua   (*)    Value: TEST NOT REPORTED DUE TO COLOR INTERFERENCE OF URINE PIGMENT   All other components within normal limits  URINALYSIS, MICROSCOPIC (REFLEX) - Abnormal; Notable for the following components:   Bacteria, UA FEW (*)    All other components within normal limits   Procedures Procedures    Medications Ordered in ED Medications  cephALEXin (KEFLEX) capsule 500 mg (has no administration in time range)    ED Course/ Medical Decision Making/ A&P                           Medical Decision Making Amount and/or Complexity of Data Reviewed Labs: ordered.   Gross hematuria.  Urinalysis confirms hematuria with greater than 50 RBCs per high-power field as well as 6-10 WBCs per high-power field and few bacteria.  Old records are reviewed confirming ED visit for gross hematuria with urinary retention in January 2020.  At that time, CT scan did show an enlarged prostate as well as evidence of urinary retention with blood in the bladder.  We will send urine for culture, he is discharged with prescription for cephalexin and he is referred back to his urologist.  Return precautions are discussed.        Final Clinical Impression(s) / ED Diagnoses Final diagnoses:  Gross hematuria    Rx / DC Orders ED Discharge Orders          Ordered    cephALEXin (KEFLEX) 500 MG capsule  3 times daily        04/10/21 [id number]              Delora Fuel, MD [id number]

## 2021-04-10 NOTE — Discharge Instructions (Signed)
Drink plenty of fluids.  Return if you start having problems passing urine.  Please make sure to follow-up with the urologist.

## 2021-04-11 LAB — URINE CULTURE: Culture: NO GROWTH

## 2021-12-11 ENCOUNTER — Other Ambulatory Visit: Payer: Self-pay | Admitting: Cardiology

## 2022-01-10 NOTE — Progress Notes (Unsigned)
Cardiology Office Note   Date:  01/12/2022   ID:  Eric Wall, DOB 08/07/1965, MRN [id number]  PCP:  Roderick Pee, PA (Inactive)  Cardiologist:   Rollene Rotunda, MD   Chief Complaint  Patient presents with   Coronary Artery Disease       History of Present Illness: Eric Wall is a 56 y.o. male who presents for follow up of CAD.  He had chest pain and I sent him for a POET (Plain Old Exercise Treadmill) which was abnormal.  Cath demonstrated an occluded LAD in 2018.  He was managed medically.  EF  WAS 50 - 55%.   Echo demonstrated normal systolic function with NL BNP.   Since I last saw him he has had no change in his condition.  He has had no new shortness of breath, PND or orthopnea.  He has had no palpitations, presyncope or syncope.  He denies any chest pressure, neck or arm discomfort.  He has had no weight gain or edema.  He drives a forklift.  Getting up and down out of this he is not describing any new symptoms.   Past Medical History:  Diagnosis Date   CAD (coronary artery disease)    Tobacco abuse     Past Surgical History:  Procedure Laterality Date   LEFT HEART CATH AND CORONARY ANGIOGRAPHY N/A 12/30/2016   Procedure: LEFT HEART CATH AND CORONARY ANGIOGRAPHY;  Surgeon: Yvonne Kendall, MD;  Location: [facility];  Service: Cardiovascular;  Laterality: N/A;     Current Outpatient Medications  Medication Sig Dispense Refill   aspirin 81 MG tablet Take 81 mg by mouth daily.      isosorbide mononitrate (IMDUR) 60 MG 24 hr tablet TAKE 1 TABLET BY MOUTH EVERY DAY 90 tablet 0   nitroGLYCERIN (NITROSTAT) 0.4 MG SL tablet Place 1 tablet (0.4 mg total) under the tongue every 5 (five) minutes as needed for chest pain. 25 tablet 3   omeprazole (PRILOSEC) 20 MG capsule Take 1 capsule by mouth daily.  1   tamsulosin (FLOMAX) 0.4 MG CAPS capsule Take 1 capsule by mouth daily.     atorvastatin (LIPITOR) 40 MG tablet Take 1 tablet (40 mg total) by  mouth daily. 90 tablet 3   cephALEXin (KEFLEX) 500 MG capsule Take 1 capsule (500 mg total) by mouth 3 (three) times daily. (Patient not taking: Reported on 01/12/2022) 30 capsule 0   metoprolol succinate (TOPROL-XL) 25 MG 24 hr tablet Take 1 tablet (25 mg total) by mouth daily. 90 tablet 3   naproxen (NAPROSYN) 375 MG tablet Take 375 mg by mouth 2 (two) times daily. (Patient not taking: Reported on 01/12/2022)     No current facility-administered medications for this visit.    Allergies:   Patient has no known allergies.   ROS:  Please see the history of present illness.   Otherwise, review of systems are positive for none.   All other systems are reviewed and negative.    PHYSICAL EXAM: VS:  BP 114/72 (BP Location: Left Arm, Patient Position: Sitting, Cuff Size: Large)   Pulse 60   Ht 5\' 10"  (1.778 m)   Wt 202 lb 3.2 oz (91.7 kg)   SpO2 96%   BMI 29.01 kg/m  , BMI Body mass index is 29.01 kg/m. GENERAL:  Well appearing NECK:  No jugular venous distention, waveform within normal limits, carotid upstroke brisk and symmetric, no bruits, no thyromegaly LUNGS:  Clear to auscultation  bilaterally CHEST:  Unremarkable HEART:  PMI not displaced or sustained,S1 and S2 within normal limits, no S3, no S4, no clicks, no rubs, no murmurs ABD:  Flat, positive bowel sounds normal in frequency in pitch, no bruits, no rebound, no guarding, no midline pulsatile mass, no hepatomegaly, no splenomegaly EXT:  2 plus pulses throughout, no edema, no cyanosis no clubbing   EKG:  EKG is  ordered today. The ekg ordered today demonstrates NSR, rate 60, axis within normal limits, intervals within normal limits, no acute ST-T wave changes.   Recent Labs: No results found for requested labs within last 365 days.    Lipid Panel    Component Value Date/Time   CHOL 107 12/15/2020 0832   TRIG 106 12/15/2020 0832   HDL 33 (L) 12/15/2020 0832   CHOLHDL 3.2 12/15/2020 0832   CHOLHDL 4.8 12/30/2016 1012    VLDL 18 12/30/2016 1012   LDLCALC 54 12/15/2020 0832   LDLDIRECT 55 12/15/2020 0832      Wt Readings from Last 3 Encounters:  01/12/22 202 lb 3.2 oz (91.7 kg)  04/09/21 210 lb 1.6 oz (95.3 kg)  12/15/20 210 lb (95.3 kg)      Other studies Reviewed: Additional studies/ records that were reviewed today include: Labs Review of the above records demonstrates:  Please see elsewhere in the note.     ASSESSMENT AND PLAN:  CAD: The patient has no new sypmtoms.  No further cardiovascular testing is indicated.  We will continue with aggressive risk reduction and meds as listed.  SOB:    This is the same as it was last year when he had his negative work-up.  Not any different than the time of his cath.  No change in therapy.    DYSLIPIDEMIA:  LDL was 55 with an HDL of 33.  No change in therapy.   TOBACCO: We tried Chantix before and it did not help.  We talked about this again this year.  Current medicines are reviewed at length with the patient today.  The patient does not have concerns regarding medicines.  The following changes have been made:  None  Labs/ tests ordered today include:     None  Orders Placed This Encounter  Procedures   Lipid panel   EKG 12-Lead      Disposition:   FU with me in one year.    Signed, Minus Breeding, MD  01/12/2022 3:13 PM    [facility]

## 2022-01-12 ENCOUNTER — Ambulatory Visit: Payer: 59 | Attending: Cardiology | Admitting: Cardiology

## 2022-01-12 ENCOUNTER — Encounter: Payer: Self-pay | Admitting: Cardiology

## 2022-01-12 VITALS — BP 114/72 | HR 60 | Ht 70.0 in | Wt 202.2 lb

## 2022-01-12 DIAGNOSIS — E785 Hyperlipidemia, unspecified: Secondary | ICD-10-CM | POA: Diagnosis not present

## 2022-01-12 DIAGNOSIS — Z720 Tobacco use: Secondary | ICD-10-CM | POA: Diagnosis not present

## 2022-01-12 DIAGNOSIS — R0602 Shortness of breath: Secondary | ICD-10-CM

## 2022-01-12 DIAGNOSIS — I2510 Atherosclerotic heart disease of native coronary artery without angina pectoris: Secondary | ICD-10-CM | POA: Diagnosis not present

## 2022-01-12 MED ORDER — METOPROLOL SUCCINATE ER 25 MG PO TB24: 25.0000 mg | ORAL_TABLET | Freq: Every day | ORAL | 3 refills | Status: AC

## 2022-01-12 MED ORDER — ATORVASTATIN CALCIUM 40 MG PO TABS: 40.0000 mg | ORAL_TABLET | Freq: Every day | ORAL | 3 refills | Status: AC

## 2022-01-12 NOTE — Patient Instructions (Signed)
Medication Instructions:  No changes *If you need a refill on your cardiac medications before your next appointment, please call your pharmacy*   Lab Work: Your provider would like for you to return in a few days to have the following labs drawn: fasting lipid. You do not need an appointment for the lab. Once in our office lobby there is a podium where you can sign in and ring the doorbell to alert [place] that you are here. The lab is open from 8:00 am to 4 pm; closed for lunch from 12:45pm-1:45pm.  You may also go to any of these [facility] locations:   [place] ([place]) - [place] [address] [place] [address] - [address] [place] - [address] ([facility]  If you have labs (blood work) drawn today and your tests are completely normal, you will receive your results only by: [employer] (if you have MyChart) OR A paper copy in the mail If you have any lab test that is abnormal or we need to change your treatment, we will call you to review the results.   Testing/Procedures: None ordered   Follow-Up: At [facility], you and your health needs are our priority.  As part of our continuing mission to provide you with exceptional heart care, we have created designated Provider Care Teams.  These Care Teams include your primary Cardiologist (physician) and Advanced Practice Providers (APPs -  Physician Assistants and Nurse Practitioners) who all work together to provide you with the care you need, when you need it.  We recommend signing up for the patient portal called "MyChart".  Sign up information is provided on this After Visit Summary.  MyChart is used to connect with patients for Virtual Visits (Telemedicine).  Patients are  able to view lab/test results, encounter notes, upcoming appointments, etc.  Non-urgent messages can be sent to your provider as well.   To learn more about what you can do with MyChart, go to [email or web address].    Your next appointment:   12 month(s)  The format for your next appointment:   In Person  Provider:   Minus Breeding, MD

## 2022-03-14 ENCOUNTER — Other Ambulatory Visit: Payer: Self-pay | Admitting: Cardiology

## 2022-08-02 ENCOUNTER — Other Ambulatory Visit: Payer: Self-pay | Admitting: Family Medicine

## 2022-08-02 DIAGNOSIS — F17219 Nicotine dependence, cigarettes, with unspecified nicotine-induced disorders: Secondary | ICD-10-CM

## 2022-09-02 ENCOUNTER — Inpatient Hospital Stay: Admission: RE | Admit: 2022-09-02 | Payer: 59 | Source: Ambulatory Visit

## 2023-01-10 ENCOUNTER — Other Ambulatory Visit: Payer: Self-pay | Admitting: Cardiology

## 2023-03-16 ENCOUNTER — Other Ambulatory Visit: Payer: Self-pay | Admitting: Cardiology

## 2023-03-17 NOTE — Telephone Encounter (Signed)
Please contact pt for future appointment. Pt overdue for 12 month f/u. 

## 2023-04-24 ENCOUNTER — Other Ambulatory Visit: Payer: Self-pay | Admitting: Cardiology
